# Patient Record
Sex: Female | Born: 1960 | Race: Black or African American | Hispanic: No | Marital: Married | Smoking: Never smoker
Health system: Southern US, Community
[De-identification: ages and names within clinical notes are randomized; demographics above are authoritative.]

## PROBLEM LIST (undated history)

## (undated) DIAGNOSIS — E079 Disorder of thyroid, unspecified: Secondary | ICD-10-CM

## (undated) DIAGNOSIS — I10 Essential (primary) hypertension: Secondary | ICD-10-CM

## (undated) DIAGNOSIS — E119 Type 2 diabetes mellitus without complications: Secondary | ICD-10-CM

## (undated) DIAGNOSIS — N200 Calculus of kidney: Secondary | ICD-10-CM

## (undated) DIAGNOSIS — T7840XA Allergy, unspecified, initial encounter: Secondary | ICD-10-CM

## (undated) DIAGNOSIS — E785 Hyperlipidemia, unspecified: Secondary | ICD-10-CM

## (undated) HISTORY — DX: Hyperlipidemia, unspecified: E78.5

## (undated) HISTORY — PX: TUBAL LIGATION: SHX77

## (undated) HISTORY — DX: Disorder of thyroid, unspecified: E07.9

## (undated) HISTORY — DX: Allergy, unspecified, initial encounter: T78.40XA

## (undated) HISTORY — DX: Essential (primary) hypertension: I10

## (undated) HISTORY — PX: OTHER SURGICAL HISTORY: SHX169

---

## 1996-03-27 HISTORY — PX: TUBAL LIGATION: SHX77

## 1998-05-31 ENCOUNTER — Encounter: Payer: Self-pay | Admitting: General Surgery

## 1998-06-01 ENCOUNTER — Ambulatory Visit (HOSPITAL_COMMUNITY): Admission: RE | Admit: 1998-06-01 | Discharge: 1998-06-02 | Payer: Self-pay | Admitting: General Surgery

## 1999-12-20 ENCOUNTER — Emergency Department (HOSPITAL_COMMUNITY): Admission: EM | Admit: 1999-12-20 | Discharge: 1999-12-20 | Payer: Self-pay | Admitting: *Deleted

## 2000-03-27 HISTORY — PX: CHOLECYSTECTOMY: SHX55

## 2001-01-16 ENCOUNTER — Ambulatory Visit (HOSPITAL_BASED_OUTPATIENT_CLINIC_OR_DEPARTMENT_OTHER): Admission: RE | Admit: 2001-01-16 | Discharge: 2001-01-17 | Payer: Self-pay | Admitting: *Deleted

## 2001-01-16 ENCOUNTER — Encounter (INDEPENDENT_AMBULATORY_CARE_PROVIDER_SITE_OTHER): Payer: Self-pay | Admitting: *Deleted

## 2001-06-11 ENCOUNTER — Encounter: Payer: Self-pay | Admitting: Emergency Medicine

## 2001-06-11 ENCOUNTER — Emergency Department (HOSPITAL_COMMUNITY): Admission: EM | Admit: 2001-06-11 | Discharge: 2001-06-11 | Payer: Self-pay | Admitting: Emergency Medicine

## 2004-05-24 ENCOUNTER — Other Ambulatory Visit: Admission: RE | Admit: 2004-05-24 | Discharge: 2004-05-24 | Payer: Self-pay | Admitting: Family Medicine

## 2004-05-27 ENCOUNTER — Encounter: Admission: RE | Admit: 2004-05-27 | Discharge: 2004-05-27 | Payer: Self-pay | Admitting: Family Medicine

## 2004-09-24 ENCOUNTER — Encounter: Admission: RE | Admit: 2004-09-24 | Discharge: 2004-09-24 | Payer: Self-pay | Admitting: Family Medicine

## 2005-10-17 ENCOUNTER — Encounter: Admission: RE | Admit: 2005-10-17 | Discharge: 2005-10-17 | Payer: Self-pay | Admitting: Family Medicine

## 2005-12-05 ENCOUNTER — Other Ambulatory Visit: Admission: RE | Admit: 2005-12-05 | Discharge: 2005-12-05 | Payer: Self-pay | Admitting: Obstetrics and Gynecology

## 2006-10-09 ENCOUNTER — Encounter: Admission: RE | Admit: 2006-10-09 | Discharge: 2006-10-09 | Payer: Self-pay | Admitting: Family Medicine

## 2006-10-15 ENCOUNTER — Emergency Department (HOSPITAL_COMMUNITY): Admission: EM | Admit: 2006-10-15 | Discharge: 2006-10-15 | Payer: Self-pay | Admitting: Emergency Medicine

## 2006-10-24 ENCOUNTER — Encounter: Admission: RE | Admit: 2006-10-24 | Discharge: 2006-10-24 | Payer: Self-pay | Admitting: Family Medicine

## 2007-10-25 ENCOUNTER — Encounter: Admission: RE | Admit: 2007-10-25 | Discharge: 2007-10-25 | Payer: Self-pay | Admitting: Family Medicine

## 2007-11-26 ENCOUNTER — Encounter: Admission: RE | Admit: 2007-11-26 | Discharge: 2007-11-26 | Payer: Self-pay | Admitting: Family Medicine

## 2008-07-09 ENCOUNTER — Encounter: Admission: RE | Admit: 2008-07-09 | Discharge: 2008-07-09 | Payer: Self-pay | Admitting: Family Medicine

## 2008-10-26 ENCOUNTER — Encounter: Admission: RE | Admit: 2008-10-26 | Discharge: 2008-10-26 | Payer: Self-pay | Admitting: Family Medicine

## 2009-10-29 ENCOUNTER — Encounter: Admission: RE | Admit: 2009-10-29 | Discharge: 2009-10-29 | Payer: Self-pay | Admitting: Advanced Practice Midwife

## 2010-03-27 HISTORY — PX: ENDOMETRIAL ABLATION: SHX621

## 2010-06-25 ENCOUNTER — Ambulatory Visit (HOSPITAL_COMMUNITY)
Admission: RE | Admit: 2010-06-25 | Discharge: 2010-06-25 | Disposition: A | Discharge status: Home / Self Care | Payer: Managed Care, Other (non HMO) | Source: Ambulatory Visit | Attending: Obstetrics and Gynecology | Admitting: Obstetrics and Gynecology

## 2010-06-25 ENCOUNTER — Other Ambulatory Visit: Payer: Self-pay | Admitting: Obstetrics and Gynecology

## 2010-06-25 DIAGNOSIS — D649 Anemia, unspecified: Secondary | ICD-10-CM | POA: Insufficient documentation

## 2010-06-25 DIAGNOSIS — D250 Submucous leiomyoma of uterus: Secondary | ICD-10-CM | POA: Insufficient documentation

## 2010-06-25 DIAGNOSIS — N840 Polyp of corpus uteri: Secondary | ICD-10-CM | POA: Insufficient documentation

## 2010-06-25 DIAGNOSIS — N920 Excessive and frequent menstruation with regular cycle: Secondary | ICD-10-CM | POA: Insufficient documentation

## 2010-06-25 LAB — CBC
HCT: 34.1 % — ABNORMAL LOW (ref 36.0–46.0)
MCH: 24.6 pg — ABNORMAL LOW (ref 26.0–34.0)
MCV: 78.4 fL (ref 78.0–100.0)
Platelets: 259 10*3/uL (ref 150–400)
RBC: 4.35 MIL/uL (ref 3.87–5.11)
RDW: 15.6 % — ABNORMAL HIGH (ref 11.5–15.5)

## 2010-06-29 NOTE — H&P (Signed)
  Donna Duarte, Donna Duarte                ACCOUNT NO.:  [id number]  MEDICAL RECORD NO.:  [id number]           PATIENT TYPE:  O  LOCATION:  [facility]                           FACILITY:  [facility]  PHYSICIAN:  Janine Limbo, M.D.DATE OF BIRTH:  1960-05-10  DATE OF ADMISSION:  05/18/2010 DATE OF DISCHARGE:                             HISTORY & PHYSICAL   HISTORY OF PRESENT ILLNESS:  Donna Duarte is a 50 year old, para 2-0-2-2, who presents for hysteroscopy with resection and a dilatation and curettage.  The patient has been followed at the [facility] for women. The patient complained of irregular cycles.  Her evaluation included a hydrosonogram which showed an 11.3 x 7.4 cm uterus.  The endometrium measured 7.53-cm.  The ovaries were within normal limits.  The patient was noted to have multiple fibroids with the largest fibroid measuring 2.43-cm.  The patient was noted to have a 2.16-cm endometrial polyps The patient has had a prior cesarean section and a prior tubal ligation.  OBSTETRICAL HISTORY:  The patient has had 2 elective pregnancy terminations, one term vaginal delivery, and one term cesarean delivery.  DRUG ALLERGIES:  The patient reports that she is allergic to PENICILLIN and FENTANYL.  PAST MEDICAL HISTORY:  The patient has a history of hypertension and she currently takes atenolol.  SOCIAL HISTORY:  The patient smokes occasionally.  She denies other recreational drug uses.  REVIEW OF SYSTEMS:  Please see history of present illness.  FAMILY HISTORY:  The patient has a family history of hypertension, diabetes, heart disease, kidney disease, and cancer (bone and stomach).  PHYSICAL EXAMINATION:  VITAL SIGNS:  Weight is 204 pounds, height is 5 feet 6 inches. HEENT:  Within normal limits. CHEST:  Clear. HEART:  Regular rate and rhythm. BREASTS:  Without masses. ABDOMEN:  Nontender. EXTREMITIES:  Grossly  normal. NEUROLOGIC:  Grossly normal. PELVIC:  External genitalia is normal.  Vagina is normal.  Cervix is nontender.  Uterus is 10-week size.  Adnexa no masses and rectovaginal exam confirms.  LABORATORY VALUES:  Endometrial biopsy showed benign elements.  ASSESSMENT: 1. Menorrhagia. 2. Fibroid uterus. 3. Endometrial polyps.  PLAN:  The patient will undergo hysteroscopy with resection of an endometrial polyp.  She understands the indications for her surgical procedure and she accepts the risks of, but not limited to, anesthetic complications, bleeding, infections, and possible damage to the surrounding organs.     Janine Limbo, M.D.     AVS/MEDQ  D:  06/23/2010  T:  06/24/2010  Job:  [id number]  Electronically Signed by Kirkland Hun M.D. on 06/29/2010 11:15:37 AM

## 2010-06-29 NOTE — Op Note (Signed)
NAMEFATMATA, LEGERE                ACCOUNT NO.:  [id number]  MEDICAL RECORD NO.:  [id number]           PATIENT TYPE:  O  LOCATION:  [facility]                          [facility]ILITY:  [facility]  PHYSICIAN:  Janine Limbo, M.D.DATE OF BIRTH:  [birth year]  DATE OF PROCEDURE:  06/25/2010 DATE OF DISCHARGE:                              OPERATIVE REPORT   PREOPERATIVE DIAGNOSES: 1. Menorrhagia. 2. Anemia (hemoglobin 10.7). 3. Fibroid uterus. 4. Endometrial polyps.  POSTOPERATIVE DIAGNOSES: 1. Menorrhagia. 2. Anemia (hemoglobin 10.7). 3. Fibroid uterus. 4. Endometrial polyps.  PROCEDURES: 1. Hysteroscopy with resection. 2. Dilatation and curettage.  SURGEON:  Janine Limbo, MD  FIRST ASSISTANT:  None.  ANESTHETIC:  General.  DISPOSITION:  Ms. Donna Duarte is a 50 year old female, para 2-0-2-2, who presents with the above-mentioned diagnoses.  The patient understands the indications for her surgical procedure and she accepts the risks of, but not limited to, anesthetic complications, bleeding, infections, and possible damage to the surrounding organs.  FINDINGS:  The uterus was approximately 10-week size and irregular.  No adnexal masses were appreciated.  The uterus sounded to 9 cm.  On hysteroscopy, the patient was noted to have a 2-3 cm endometrial polyp. She was also noted to have two submucosal fibroids with the largest measuring less than a centimeter in size.  No other pathology was appreciated.  PROCEDURE IN DETAIL:  The patient was taken to the operating room where a general anesthetic was given.  The patient's perineum and vagina were prepped with multiple layers of Betadine.  The bladder was drained of urine.  The patient was then sterilely draped.  Examination under anesthesia was performed.  A paracervical block was placed using 10 mL of 0.5% Marcaine with epinephrine.  An endocervical curettage was performed.  The uterus was sounded to 9 cm.  The cervix was  gently dilated.  The diagnostic hysteroscope was inserted and the cavity was carefully inspected.  The polyps and the fibroids were noted.  The diagnostic hysteroscope was removed and the cervix was dilated further. The operative hysteroscope was inserted.  We then used a single loop to resect the endometrial polyps and also the submucosal fibroids.  The hysteroscope was removed and the cavity was then curetted using a medium sharp curette until it was felt to be completely clean.  The hysteroscope was inserted once again and the cavity was inspected.  A few other areas were resected and the cavity was noted to be clean. Hemostasis was noted to be adequate.  All instruments were then removed. The uterus was reexamined and noted to be firm.  Sponge and needle counts were noted to be correct.  The patient was awakened from her anesthetic without difficulty, and she was transported to the recovery room in stable condition.  The endocervical curettings, the endometrial resections, and the endometrial curettings were sent to pathology.  The estimated fluid deficit was less than 200 mL.  The estimated blood loss was less than 30 mL.  FOLLOWUP INSTRUCTIONS:  The patient will return to see Dr. Stefano Gaul in 2 weeks for followup examination.  She was given a copy of the postoperative  instruction sheet as prepared by the [facility] for patients who have undergone hysteroscopy.  She will call for questions or concerns.  She was given a prescription for: 1. Motrin 800 mg 1 tablet every 8 hours as needed for mild-to-moderate     pain. 2. Vicodin 1 or 2 tablets every 4 hours as needed for severe pain. 3. Phenergan 25 mg 1 tablet every 6 hours as needed for nausea.     Janine Limbo, M.D.     AVS/MEDQ  D:  06/25/2010  T:  06/26/2010  Job:  [id number]  Electronically Signed by Kirkland Hun M.D. on 06/29/2010 11:15:50 AM

## 2010-08-12 NOTE — Op Note (Signed)
[place]  Patient:    Donna Duarte, Donna Duarte Visit Number: [id number] MRN: [id number]          Service Type: DSU Location: [facility] Attending Physician:  Aundria Mems Dictated by:   Kathy Breach, M.D. Proc. Date: 01/16/01 Admit Date:  01/16/2001                             Operative Report  PREOPERATIVE DIAGNOSIS:  Chronic tonsillitis and tonsilliths  PROCEDURE:  Tonsillectomy.  POSTOPERATIVE DIAGNOSIS:  Chronic tonsillitis and tonsilliths.  PROCEDURE:  The patient under general orotracheal anesthesia, the Crowe-Davis mouth gag was inserted, and the patient put in the Rutledge position.  The patient had small, deeply cryptic tonsils with large tonsilliths superior pole area left tonsil present.  Soft palate was normal in configuration.  Nasopharyngeal mirror examination revealed minimal adenoid tissue with no foreign debris present.  The left tonsil was grasped at the superior pole with a large tonsillith oozing from the deep crypts.  The tonsil was removed from the fossa by electrical dissection, maintaining hemostasis with electrocautery. The right tonsil was removed in similar fashion.  The patient tolerated the procedure well, was taken to the recovery room in stable general condition. Dictated by:   Kathy Breach, M.D. Attending Physician:  Aundria Mems DD:  01/16/01 TD:  01/17/01 Job: [id number]

## 2010-10-03 ENCOUNTER — Other Ambulatory Visit: Payer: Self-pay | Admitting: Family Medicine

## 2010-10-03 DIAGNOSIS — Z1231 Encounter for screening mammogram for malignant neoplasm of breast: Secondary | ICD-10-CM

## 2010-10-20 ENCOUNTER — Other Ambulatory Visit: Payer: Self-pay | Admitting: Internal Medicine

## 2010-10-20 DIAGNOSIS — E042 Nontoxic multinodular goiter: Secondary | ICD-10-CM

## 2010-10-24 ENCOUNTER — Ambulatory Visit
Admission: RE | Admit: 2010-10-24 | Discharge: 2010-10-24 | Disposition: A | Discharge status: Home / Self Care | Payer: Managed Care, Other (non HMO) | Source: Ambulatory Visit | Attending: Internal Medicine | Admitting: Internal Medicine

## 2010-10-24 DIAGNOSIS — E042 Nontoxic multinodular goiter: Secondary | ICD-10-CM

## 2010-11-03 ENCOUNTER — Ambulatory Visit
Admission: RE | Admit: 2010-11-03 | Discharge: 2010-11-03 | Disposition: A | Discharge status: Home / Self Care | Payer: Managed Care, Other (non HMO) | Source: Ambulatory Visit | Attending: Family Medicine | Admitting: Family Medicine

## 2010-11-03 DIAGNOSIS — Z1231 Encounter for screening mammogram for malignant neoplasm of breast: Secondary | ICD-10-CM

## 2011-12-01 ENCOUNTER — Other Ambulatory Visit: Payer: Self-pay | Admitting: Family Medicine

## 2011-12-01 DIAGNOSIS — Z1231 Encounter for screening mammogram for malignant neoplasm of breast: Secondary | ICD-10-CM

## 2011-12-13 ENCOUNTER — Ambulatory Visit
Admission: RE | Admit: 2011-12-13 | Discharge: 2011-12-13 | Disposition: A | Discharge status: Home / Self Care | Payer: Managed Care, Other (non HMO) | Source: Ambulatory Visit | Attending: Family Medicine | Admitting: Family Medicine

## 2011-12-13 DIAGNOSIS — Z1231 Encounter for screening mammogram for malignant neoplasm of breast: Secondary | ICD-10-CM

## 2011-12-15 ENCOUNTER — Other Ambulatory Visit: Payer: Self-pay | Admitting: Internal Medicine

## 2011-12-15 DIAGNOSIS — E049 Nontoxic goiter, unspecified: Secondary | ICD-10-CM

## 2011-12-19 ENCOUNTER — Ambulatory Visit (INDEPENDENT_AMBULATORY_CARE_PROVIDER_SITE_OTHER): Payer: Managed Care, Other (non HMO) | Admitting: Obstetrics and Gynecology

## 2011-12-19 ENCOUNTER — Encounter: Payer: Self-pay | Admitting: Obstetrics and Gynecology

## 2011-12-19 VITALS — BP 130/76 | Ht 67.0 in | Wt 216.0 lb

## 2011-12-19 DIAGNOSIS — Z01419 Encounter for gynecological examination (general) (routine) without abnormal findings: Secondary | ICD-10-CM

## 2011-12-19 NOTE — Progress Notes (Signed)
ANNUAL GYNECOLOGIC EXAMINATION   Donna Duarte is a 51 y.o. female, No obstetric history on file., who presents for an annual exam. She complains that her hair is thinning.  She has a family history of the same.  She continued to have monthly regular cycles.  She has a known history of fibroids.   History   Social History  . Marital Status: Married    Spouse Name: N/A    Number of Children: N/A  . Years of Education: N/A   Social History Main Topics  . Smoking status: Never Smoker   . Smokeless tobacco: Never Used  . Alcohol Use: Yes  . Drug Use: No  . Sexually Active: Yes    Birth Control/ Protection: Surgical     BTL   Other Topics Concern  . None   Social History Narrative  . None    Menstrual cycle:   LMP: Patient's last menstrual period was 12/16/2011.             The following portions of the patient's history were reviewed and updated as appropriate: allergies, current medications, past family history, past medical history, past social history, past surgical history and problem list.  Review of Systems Pertinent items are noted in HPI. Breast:Negative for breast lump,nipple discharge or nipple retraction Gastrointestinal: Negative for abdominal pain, change in bowel habits or rectal bleeding Urinary:negative   Objective:    BP 130/76  Ht 5\' 7"  (1.702 m)  Wt 216 lb (97.977 kg)  BMI 33.83 kg/m2  LMP 12/16/2011    Weight:  Wt Readings from Last 1 Encounters:  12/19/11 216 lb (97.977 kg)          BMI: Body mass index is 33.83 kg/(m^2).  General Appearance: Alert, appropriate appearance for age. No acute distress HEENT: Grossly normal, thinning hair Neck / Thyroid: Supple, no masses, nodes or enlargement Lungs: clear to auscultation bilaterally Back: No CVA tenderness Breast Exam: No masses or nodes.No dimpling, nipple retraction or discharge. Cardiovascular: Regular rate and rhythm. S1, S2, no murmur Gastrointestinal: Soft, non-tender, no masses or  organomegaly  ++++++++++++++++++++++++++++++++++++++++++++++++++++++++  Pelvic Exam: External genitalia: normal general appearance Vaginal: normal without tenderness, induration or masses. Relaxation: Yes Cervix: normal appearance Adnexa: normal bimanual exam Uterus: upper limits normal size, shape, and consistency Rectovaginal: normal rectal, no masses  ++++++++++++++++++++++++++++++++++++++++++++++++++++++++  Lymphatic Exam: Non-palpable nodes in neck, clavicular, axillary, or inguinal regions Neurologic: Normal speech, no tremor  Psychiatric: Alert and oriented, appropriate affect.  Assessment:    Normal gyn exam   Overweight or obese: Yes   Pelvic relaxation: Yes  alopecia   Plan:    mammogram pap smear return annually or prn Contraception:bilateral tubal ligation    Medications prescribed: none  STD screen request: No   The updated Pap smear screening guidelines were discussed with the patient. The patient requested that I obtain a Pap smear: Yes.  Kegel exercises discussed: Yes.  Proper diet and regular exercise were reviewed.  Annual mammograms recommended starting at age 61. Proper breast care was discussed.  Screening colonoscopy is recommended beginning at age 46.  Regular health maintenance was reviewed.  Sleep hygiene was discussed.  Adequate calcium and vitamin D intake was emphasized.  Leonard Schwartz M.D.   Regular Periods: yes Mammogram: yes  Monthly Breast Ex.: yes Exercise: no  Tetanus < 10 years: yes Seatbelts: yes  NI. Bladder Functn.: yes Abuse at home: no  Daily BM's: yes Stressful Work: yes  Healthy Diet: yes Sigmoid-Colonoscopy: yes 2011  Calcium: yes Medical problems this year: no   LAST PAP:2011 wnl   Contraception: BTL  Mammogram:  12/13/2011  PCP: Lavada Mesi  PMH: unchanged  FMH: unchanged   Last Bone Scan: never had one

## 2011-12-20 LAB — PAP IG W/ RFLX HPV ASCU

## 2011-12-25 ENCOUNTER — Ambulatory Visit
Admission: RE | Admit: 2011-12-25 | Discharge: 2011-12-25 | Disposition: A | Discharge status: Home / Self Care | Payer: Managed Care, Other (non HMO) | Source: Ambulatory Visit | Attending: Internal Medicine | Admitting: Internal Medicine

## 2011-12-25 DIAGNOSIS — E049 Nontoxic goiter, unspecified: Secondary | ICD-10-CM

## 2012-02-27 ENCOUNTER — Emergency Department (HOSPITAL_COMMUNITY): Payer: Managed Care, Other (non HMO)

## 2012-02-27 ENCOUNTER — Encounter (HOSPITAL_COMMUNITY): Payer: Self-pay | Admitting: *Deleted

## 2012-02-27 DIAGNOSIS — Z862 Personal history of diseases of the blood and blood-forming organs and certain disorders involving the immune mechanism: Secondary | ICD-10-CM | POA: Insufficient documentation

## 2012-02-27 DIAGNOSIS — R110 Nausea: Secondary | ICD-10-CM | POA: Insufficient documentation

## 2012-02-27 DIAGNOSIS — M549 Dorsalgia, unspecified: Secondary | ICD-10-CM | POA: Insufficient documentation

## 2012-02-27 DIAGNOSIS — Z7982 Long term (current) use of aspirin: Secondary | ICD-10-CM | POA: Insufficient documentation

## 2012-02-27 DIAGNOSIS — N201 Calculus of ureter: Secondary | ICD-10-CM | POA: Insufficient documentation

## 2012-02-27 DIAGNOSIS — Z8639 Personal history of other endocrine, nutritional and metabolic disease: Secondary | ICD-10-CM | POA: Insufficient documentation

## 2012-02-27 LAB — POCT I-STAT, CHEM 8
BUN: 10 mg/dL (ref 6–23)
Calcium, Ion: 1.23 mmol/L (ref 1.12–1.23)
Chloride: 103 mEq/L (ref 96–112)
Creatinine, Ser: 0.9 mg/dL (ref 0.50–1.10)
Glucose, Bld: 145 mg/dL — ABNORMAL HIGH (ref 70–99)
TCO2: 23 mmol/L (ref 0–100)

## 2012-02-27 LAB — URINALYSIS, ROUTINE W REFLEX MICROSCOPIC
Leukocytes, UA: NEGATIVE
Nitrite: NEGATIVE
Protein, ur: NEGATIVE mg/dL
Specific Gravity, Urine: 1.017 (ref 1.005–1.030)
Urobilinogen, UA: 0.2 mg/dL (ref 0.0–1.0)

## 2012-02-27 LAB — URINE MICROSCOPIC-ADD ON

## 2012-02-27 MED ORDER — OXYCODONE-ACETAMINOPHEN 5-325 MG PO TABS
1.0000 | ORAL_TABLET | Freq: Once | ORAL | Status: AC
  Administered 2012-02-27: 1 via ORAL
  Filled 2012-02-27: qty 1

## 2012-02-27 NOTE — ED Notes (Signed)
Patient with right side flank pain for about two hours.  Patient has history of kidney stones and states that the symptoms feel the same

## 2012-02-28 ENCOUNTER — Emergency Department (HOSPITAL_COMMUNITY): Payer: Managed Care, Other (non HMO)

## 2012-02-28 ENCOUNTER — Encounter (HOSPITAL_COMMUNITY): Payer: Self-pay | Admitting: Radiology

## 2012-02-28 ENCOUNTER — Emergency Department (HOSPITAL_COMMUNITY)
Admission: EM | Admit: 2012-02-28 | Discharge: 2012-02-28 | Disposition: A | Discharge status: Home / Self Care | Payer: Managed Care, Other (non HMO) | Attending: Emergency Medicine | Admitting: Emergency Medicine

## 2012-02-28 DIAGNOSIS — N201 Calculus of ureter: Secondary | ICD-10-CM

## 2012-02-28 LAB — HEPATIC FUNCTION PANEL
Albumin: 3.6 g/dL (ref 3.5–5.2)
Alkaline Phosphatase: 82 U/L (ref 39–117)
Total Protein: 7.5 g/dL (ref 6.0–8.3)

## 2012-02-28 MED ORDER — SODIUM CHLORIDE 0.9 % IV BOLUS (SEPSIS)
1000.0000 mL | Freq: Once | INTRAVENOUS | Status: AC
  Administered 2012-02-28: 1000 mL via INTRAVENOUS

## 2012-02-28 MED ORDER — IBUPROFEN 800 MG PO TABS: 800.0000 mg | ORAL_TABLET | Freq: Three times a day (TID) | ORAL | Status: DC

## 2012-02-28 MED ORDER — ONDANSETRON HCL 4 MG PO TABS: 4.0000 mg | ORAL_TABLET | Freq: Four times a day (QID) | ORAL | Status: DC

## 2012-02-28 MED ORDER — OXYCODONE-ACETAMINOPHEN 5-325 MG PO TABS: 2.0000 | ORAL_TABLET | ORAL | Status: DC | PRN

## 2012-02-28 NOTE — ED Provider Notes (Signed)
History     CSN: [id number]  Arrival date & time 02/27/12  2201   First MD Initiated Contact with Patient 02/28/12 0044      Chief Complaint  Patient presents with  . Flank Pain    (Consider location/radiation/quality/duration/timing/severity/associated sxs/prior treatment) HPI Comments: Patient presents with right-sided flank and abdominal pain that started about 2 hours ago while laying in bed. Symptoms feel similar to previous kidney stones. Pain is now resolved after receiving Percocet in the waiting room. She endorses nausea but no vomiting. Denies dysuria or hematuria. Denies fever, chills, chest pain or shortness of breath. Has never required surgery for kidney stones. Previous cholecystectomy.  The history is provided by the patient and a relative.    Past Medical History  Diagnosis Date  . Allergy   . Thyroid disease     Past Surgical History  Procedure Date  . Tubal ligation   . Cesarean section   . Endo cervix  cuttrage     Family History  Problem Relation Age of Onset  . Hypertension Mother   . Diabetes Mother     History  Substance Use Topics  . Smoking status: Never Smoker   . Smokeless tobacco: Never Used  . Alcohol Use: Yes    OB History    Grav Para Term Preterm Abortions TAB SAB Ect Mult Living                  Review of Systems  Constitutional: Negative for fever, activity change and appetite change.  Respiratory: Negative for chest tightness.   Cardiovascular: Negative for chest pain.  Gastrointestinal: Positive for nausea and abdominal pain. Negative for vomiting and diarrhea.  Genitourinary: Positive for flank pain. Negative for dysuria and hematuria.  Musculoskeletal: Positive for back pain.  Neurological: Negative for headaches.    Allergies  Fish oil; Penicillins; and Fentanyl  Home Medications   Current Outpatient Rx  Name  Route  Sig  Dispense  Refill  . ASPIRIN EC 81 MG PO TBEC   Oral   Take 81 mg by mouth daily.         Marland Kitchen ELETRIPTAN HYDROBROMIDE 40 MG PO TABS   Oral   One tablet by mouth at onset of headache. May repeat in 2 hours if headache persists or recurs. may repeat in 2 hours if necessary For migraine headaches         . LISINOPRIL 10 MG PO TABS   Oral   Take 10 mg by mouth daily.         . IBUPROFEN 800 MG PO TABS   Oral   Take 1 tablet (800 mg total) by mouth 3 (three) times daily.   21 tablet   0   . ONDANSETRON HCL 4 MG PO TABS   Oral   Take 1 tablet (4 mg total) by mouth every 6 (six) hours.   12 tablet   0   . OXYCODONE-ACETAMINOPHEN 5-325 MG PO TABS   Oral   Take 2 tablets by mouth every 4 (four) hours as needed for pain.   15 tablet   0     BP 137/80  Pulse 93  Temp 98.8 F (37.1 C) (Oral)  Resp 18  SpO2 98%  LMP 12/27/2011  Physical Exam  Constitutional: She is oriented to person, place, and time. She appears well-developed and well-nourished. No distress.       Appears comfortable  HENT:  Head: Normocephalic and atraumatic.  Mouth/Throat: Oropharynx is clear  and moist. No oropharyngeal exudate.  Eyes: Conjunctivae normal and EOM are normal. Pupils are equal, round, and reactive to light.  Neck: Normal range of motion. Neck supple.  Cardiovascular: Normal rate, regular rhythm and normal heart sounds.   Pulmonary/Chest: Effort normal and breath sounds normal.  Abdominal: Soft. There is no tenderness. There is no rebound and no guarding.       Mild R sided tenderness. No guarding or rebound.  Negative murphy's and mcburney's  Musculoskeletal: Normal range of motion. She exhibits no edema and no tenderness.       No CVAT  Neurological: She is alert and oriented to person, place, and time. No cranial nerve deficit. Coordination normal.  Skin: Skin is warm.    ED Course  Procedures (including critical care time)  Labs Reviewed  URINALYSIS, ROUTINE W REFLEX MICROSCOPIC - Abnormal; Notable for the following:    APPearance HAZY (*)     Hgb urine  dipstick MODERATE (*)     All other components within normal limits  URINE MICROSCOPIC-ADD ON - Abnormal; Notable for the following:    Squamous Epithelial / LPF FEW (*)     All other components within normal limits  POCT I-STAT, CHEM 8 - Abnormal; Notable for the following:    Glucose, Bld 145 (*)     Hemoglobin 11.6 (*)     HCT 34.0 (*)     All other components within normal limits  HEPATIC FUNCTION PANEL - Abnormal; Notable for the following:    Total Bilirubin 0.2 (*)     All other components within normal limits   Ct Abdomen Pelvis Wo Contrast  02/28/2012  *RADIOLOGY REPORT*  Clinical Data: Right flank pain.  Urolithiasis.  CT ABDOMEN AND PELVIS WITHOUT CONTRAST  Technique:  Multidetector CT imaging of the abdomen and pelvis was performed following the standard protocol without intravenous contrast.  Comparison: 10/09/2006  Findings: Two nonobstructing calculi are again seen in the left kidney, largest measuring 6 mm in the lower pole.  No right intrarenal calculi are identified.  Mild right hydronephrosis and ureterectasis is seen.  A 3-4 mm distal right ureteral calculus is seen.  The other abdominal parenchymal organs have a normal appearance on this noncontrast study.  Surgical clips are seen from prior cholecystectomy.  Enlarged uterus again seen with probable fibroid in the fundus.  No lymphadenopathy identified.  No evidence of inflammatory process or abnormal fluid collections.  No evidence of dilated bowel loops.  IMPRESSION:  1.  3-4 mm distal right ureteral calculus causing mild right hydronephrosis. 2.  Nonobstructing left nephrolithiasis. 3.  Enlarged uterus with probable fundal fibroid.   Original Report Authenticated By: Myles Rosenthal, M.D.    Dg Abd 1 View  02/27/2012  *RADIOLOGY REPORT*  Clinical Data: Right flank pain. Urolithiasis.  ABDOMEN - 1 VIEW  Comparison:  None.  Findings:  The bowel gas pattern is normal.  There is no evidence of free air.  No radio-opaque calculi or  other significant radiographic abnormality is seen. Surgical clips seen from prior cholecystectomy.  IMPRESSION: Negative.   Original Report Authenticated By: Myles Rosenthal, M.D.      1. Ureterolithiasis       MDM  Right-sided abdominal pain and flank pain with history of kidney stone. Vital stable, no distress, pain free now  Hematuria in UA without infection. Patient pain-free at her Percocet. Creatinine within normal limits. Distal right-sided ureteral calculus seen. We'll treat with pain medication, anti-inflammatories, urology followup.  Glynn Octave, MD 02/28/12 [phone]

## 2012-08-13 ENCOUNTER — Other Ambulatory Visit: Payer: Self-pay | Admitting: Obstetrics and Gynecology

## 2012-10-22 ENCOUNTER — Other Ambulatory Visit: Payer: Self-pay | Admitting: Internal Medicine

## 2012-10-22 DIAGNOSIS — E042 Nontoxic multinodular goiter: Secondary | ICD-10-CM

## 2012-10-24 ENCOUNTER — Ambulatory Visit
Admission: RE | Admit: 2012-10-24 | Discharge: 2012-10-24 | Disposition: A | Discharge status: Home / Self Care | Payer: BC Managed Care – PPO | Source: Ambulatory Visit | Attending: Internal Medicine | Admitting: Internal Medicine

## 2012-10-24 DIAGNOSIS — E042 Nontoxic multinodular goiter: Secondary | ICD-10-CM

## 2012-11-05 ENCOUNTER — Other Ambulatory Visit: Payer: Self-pay

## 2012-11-05 DIAGNOSIS — Z1231 Encounter for screening mammogram for malignant neoplasm of breast: Secondary | ICD-10-CM

## 2012-11-13 ENCOUNTER — Encounter (INDEPENDENT_AMBULATORY_CARE_PROVIDER_SITE_OTHER): Payer: Self-pay | Admitting: Surgery

## 2012-11-13 ENCOUNTER — Ambulatory Visit (INDEPENDENT_AMBULATORY_CARE_PROVIDER_SITE_OTHER): Payer: BC Managed Care – PPO | Admitting: Surgery

## 2012-11-13 VITALS — BP 142/86 | HR 92 | Temp 97.3°F | Resp 16 | Ht 65.5 in | Wt 208.2 lb

## 2012-11-13 DIAGNOSIS — E042 Nontoxic multinodular goiter: Secondary | ICD-10-CM | POA: Insufficient documentation

## 2012-11-13 NOTE — Patient Instructions (Signed)
Thyroid Diseases  Your thyroid is a butterfly-shaped gland in your neck. It is located just above your collarbone. It is one of your endocrine glands, which make hormones. The thyroid helps set your metabolism. Metabolism is how your body gets energy from the foods you eat.   Millions of people have thyroid diseases. Women experience thyroid problems more often than men. In fact, overactive thyroid problems (hyperthyroidism) occur in 1% of all women. If you have a thyroid disease, your body may use energy more slowly or quickly than it should.   Thyroid problems also include an immune disease where your body reacts against your thyroid gland (called thyroiditis). A different problem involves lumps and bumps (called nodules) that develop in the gland. The nodules are usually, but not always, noncancerous.  THE MOST COMMON THYROID PROBLEMS AND CAUSES ARE DISCUSSED BELOW  There are many causes for thyroid problems. Treatment depends upon the exact diagnosis and includes trying to reset your body's metabolism to a normal rate.  Hyperthyroidism  Too much thyroid hormone from an overactive thyroid gland is called hyperthyroidism. In hyperthyroidism, the body's metabolism speeds up. One of the most frequent forms of hyperthyroidism is known as Graves' disease. Graves' disease tends to run in families. Although Graves' is thought to be caused by a problem with the immune system, the exact nature of the genetic problem is unknown.  Hypothyroidism  Too little thyroid hormone from an underactive thyroid gland is called hypothyroidism. In hypothyroidism, the body's metabolism is slowed. Several things can cause this condition. Most causes affect the thyroid gland directly and hurt its ability to make enough hormone.   Rarely, there may be a pituitary gland tumor (located near the base of the brain). The tumor can block the pituitary from producing thyroid-stimulating hormone (TSH). Your body makes TSH to stimulate the thyroid  to work properly. If the pituitary does not make enough TSH, the thyroid fails to make enough hormones needed for good health.  Whether the problem is caused by thyroid conditions or by the pituitary gland, the result is that the thyroid is not making enough hormones. Hypothyroidism causes many physical and mental processes to become sluggish. The body consumes less oxygen and produces less body heat.  Thyroid Nodules  A thyroid nodule is a small swelling or lump in the thyroid gland. They are common. These nodules represent either a growth of thyroid tissue or a fluid-filled cyst. Both form a lump in the thyroid gland. Almost half of all people will have tiny thyroid nodules at some point in their lives. Typically, these are not noticeable until they become large and affect normal thyroid size. Larger nodules that are greater than a half inch across (about 1 centimeter) occur in about 5 percent of people.  Although most nodules are not cancerous, people who have them should seek medical care to rule out cancer. Also, some thyroid nodules may produce too much thyroid hormone or become too large. Large nodules or a large gland can interfere with breathing or swallowing or may cause neck discomfort.  Other problems  Other thyroid problems include cancer and thyroiditis. Thyroiditis is a malfunction of the body's immune system. Normally, the immune system works to defend the body against infection and other problems. When the immune system is not working properly, it may mistakenly attack normal cells, tissues, and organs. Examples of autoimmune diseases are Hashimoto's thyroiditis (which causes low thyroid function) and Graves' disease (which causes excess thyroid function).  SYMPTOMS   Symptoms   vary greatly depending upon the exact type of problem with the thyroid.  Hyperthyroidism-is when your thyroid is too active and makes more thyroid hormone than your body needs. The most common cause is Graves' Disease. Too  much thyroid hormone can cause some or all of the following symptoms:  · Anxiety.  · Irritability.  · Difficulty sleeping.  · Fatigue.  · A rapid or irregular heartbeat.  · A fine tremor of your hands or fingers.  · An increase in perspiration.  · Sensitivity to heat.  · Weight loss, despite normal food intake.  · Brittle hair.  · Enlargement of your thyroid gland (goiter).  · Light menstrual periods.  · Frequent bowel movements.  Graves' disease can specifically cause eye and skin problems. The skin problems involve reddening and swelling of the skin, often on your shins and on the top of your feet. Eye problems can include the following:  · Excess tearing and sensation of grit or sand in either or both eyes.  · Reddened or inflamed eyes.  · Widening of the space between your eyelids.  · Swelling of the lids and tissues around the eyes.  · Light sensitivity.  · Ulcers on the cornea.  · Double vision.  · Limited eye movements.  · Blurred or reduced vision.  Hypothyroidism- is when your thyroid gland is not active enough. This is more common than hyperthyroidism. Symptoms can vary a lot depending of the severity of the hormone deficiency. Symptoms may develop over a long period of time and can include several of the following:  · Fatigue.  · Sluggishness.  · Increased sensitivity to cold.  · Constipation.  · Pale, dry skin.  · A puffy face.  · Hoarse voice.  · High blood cholesterol level.  · Unexplained weight gain.  · Muscle aches, tenderness and stiffness.  · Pain, stiffness or swelling in your joints.  · Muscle weakness.  · Heavier than normal menstrual periods.  · Brittle fingernails and hair.  · Depression.  Thyroid Nodules - most do not cause signs or symptoms. Occasionally, some may become so large that you can feel or even see the swelling at the base of your neck. You may realize a lump or swelling is there when you are shaving or putting on makeup. Men might become aware of a nodule when shirt collars  suddenly feel too tight.  Some nodules produce too much thyroid hormone. This can produce the same symptoms as hyperthyroidism (see above).  Thyroid nodules are seldom cancerous. However, a nodule is more likely to be malignant (cancerous) if it:  · Grows quickly or feels hard.  · Causes you to become hoarse or to have trouble swallowing or breathing.  · Causes enlarged lymph nodes under your jaw or in your neck.  DIAGNOSIS   Because there are so many possible thyroid conditions, your caregiver may ask for a number of tests. They will do this in order to narrow down the exact diagnosis. These tests can include:  · Blood and antibody tests.  · Special thyroid scans using small, safe amounts of radioactive iodine.  · Ultrasound of the thyroid gland (particularly if there is a nodule or lump).  · Biopsy. This is usually done with a special needle. A needle biopsy is a procedure to obtain a sample of cells from the thyroid. The tissue will be tested in a lab and examined under a microscope.  TREATMENT   Treatment depends on the exact diagnosis.    Hyperthyroidism  · Beta-blockers help relieve many of the symptoms.  · Anti-thyroid medications prevent the thyroid from making excess hormones.  · Radioactive iodine treatment can destroy overactive thyroid cells. The iodine can permanently decrease the amount of hormone produced.  · Surgery to remove the thyroid gland.  · Treatments for eye problems that come from Graves' disease also include medications and special eye surgery, if felt to be appropriate.  Hypothyroidism  Thyroid replacement with levothyroxine is the mainstay of treatment. Treatment with thyroid replacement is usually lifelong and will require monitoring and adjustment from time to time.  Thyroid Nodules  · Watchful waiting. If a small nodule causes no symptoms or signs of cancer on biopsy, then no treatment may be chosen at first. Re-exam and re-checking blood tests would be the recommended  follow-up.  · Anti-thyroid medications or radioactive iodine treatment may be recommended if the nodules produce too much thyroid hormone (see Treatment for Hyperthyroidism above).  · Alcohol ablation. Injections of small amounts of ethyl alcohol (ethanol) can cause a non-cancerous nodule to shrink in size.  · Surgery (see Treatment for Hyperthyroidism above).  HOME CARE INSTRUCTIONS   · Take medications as instructed.  · Follow through on recommended testing.  SEEK MEDICAL CARE IF:   · You feel that you are developing symptoms of Hyperthyroidism or Hypothyroidism as described above.  · You develop a new lump/nodule in the neck/thyroid area that you had not noticed before.  · You feel that you are having side effects from medicines prescribed.  · You develop trouble breathing or swallowing.  SEEK IMMEDIATE MEDICAL CARE IF:   · You develop a fever of 102° F (38.9° C) or higher.  · You develop severe sweating.  · You develop palpitations and/or rapid heart beat.  · You develop shortness of breath.  · You develop nausea and vomiting.  · You develop extreme shakiness.  · You develop agitation.  · You develop lightheadedness or have a fainting episode.  Document Released: 01/08/2007 Document Revised: 06/05/2011 Document Reviewed: 01/08/2007  [employer]® Patient Information ©2014 [employer].

## 2012-11-13 NOTE — Progress Notes (Signed)
General Surgery [facility]  Chief Complaint  Patient presents with  . New Evaluation    multinodular thyroid - referral from Dr. Debara Pickett; primary care is Milus Height, [place]    HISTORY: Patient is a 52 year old female referred by her endocrinologist to discuss possible thyroid surgery. The patient has a multinodular thyroid goiter. This has been present for several years. She has never been on thyroid medication. She has been evaluated with sequential ultrasound scans. Laboratory levels are normal and her most recent TSH level was normal at 1.07. She has undergone previous fine-needle aspiration biopsies of the dominant nodules at [facility]. Cytopathology results were reportedly benign.  Patient denies any prior history of head or neck surgery. There is no family history of thyroid disease. Specifically there is no history of thyroid cancer. There is no family history of other endocrinopathy.  Patient denies any compressive symptoms. She denies palpitations. She denies tremor.  Patient has had recent chest pain on occasion. She is scheduled for cardiology consultation in the near future.  Past Medical History  Diagnosis Date  . Allergy   . Thyroid disease   . Hypertension     Current Outpatient Prescriptions  Medication Sig Dispense Refill  . aspirin EC 81 MG tablet Take 81 mg by mouth daily.      Marland Kitchen lisinopril (PRINIVIL,ZESTRIL) 10 MG tablet Take 10 mg by mouth daily.      Marland Kitchen eletriptan (RELPAX) 40 MG tablet One tablet by mouth at onset of headache. May repeat in 2 hours if headache persists or recurs. may repeat in 2 hours if necessary For migraine headaches       No current facility-administered medications for this visit.    Allergies  Allergen Reactions  . Fish Oil Hives and Itching  . Penicillins Hives and Swelling  . Fentanyl Itching    Family History  Problem Relation Age of Onset  . Hypertension Mother   . Diabetes Mother      History   Social History  . Marital Status: Married    Spouse Name: N/A    Number of Children: N/A  . Years of Education: N/A   Social History Main Topics  . Smoking status: Never Smoker   . Smokeless tobacco: Never Used  . Alcohol Use: Yes  . Drug Use: No  . Sexual Activity: Yes    Birth Control/ Protection: Surgical     Comment: BTL   Other Topics Concern  . None   Social History Narrative  . None    REVIEW OF SYSTEMS - PERTINENT POSITIVES ONLY: Denies tremor. Denies palpitation. Denies compressive symptoms.  EXAM: Filed Vitals:   11/13/12 1006  BP: 142/86  Pulse: 92  Temp: 97.3 F (36.3 C)  Resp: 16    HEENT: normocephalic; pupils equal and reactive; sclerae clear; dentition good; mucous membranes moist NECK:  Firm thyroid gland, diffusely nodular, right lobe larger than left; asymmetric on extension; no palpable anterior or posterior cervical lymphadenopathy; no supraclavicular masses; no tenderness CHEST: clear to auscultation bilaterally without rales, rhonchi, or wheezes CARDIAC: regular rate and rhythm without significant murmur; peripheral pulses are full EXT:  non-tender without edema; no deformity NEURO: no gross focal deficits; no sign of tremor   LABORATORY RESULTS: See [facility] (CHL-Epic) for most recent results  RADIOLOGY RESULTS: See [facility] (CHL-Epic) for most recent results  IMPRESSION: Multinodular thyroid goiter, clinically stable  PLAN: The patient and I discussed multinodular thyroid goiter and its  management at length. I explained to her that her risk of malignancy based on her studies is less than 10%. We discussed surgical management with total thyroidectomy. We discussed the risk and benefits. We discussed the hospital stay to be anticipated. We discussed the need for lifelong thyroid hormone replacement and continued laboratory studies for monitoring. She understands. We discussed the option of continued  observation with physical examination, laboratory studies, and ultrasound examination when indicated. She understands this approach as well.  Patient is scheduled for cardiology consultation in the near future. I have encouraged her to keep that appointment incomplete data evaluation. I've asked her to send [place] a copy of those results.  I've asked the patient to discuss possible thyroid surgery with her family. She will review the written literature which I provided to her today.  After her cardiology consultation is complete, the patient will make a decision about continued observation of her multinodular goiter versus proceeding with total thyroidectomy.  Velora Heckler, MD, FACS General & Endocrine Surgery [facility]  Primary Care Physician: Pcp Not In System

## 2012-11-18 ENCOUNTER — Encounter (INDEPENDENT_AMBULATORY_CARE_PROVIDER_SITE_OTHER): Payer: Self-pay

## 2012-12-16 ENCOUNTER — Ambulatory Visit
Admission: RE | Admit: 2012-12-16 | Discharge: 2012-12-16 | Disposition: A | Discharge status: Home / Self Care | Payer: BC Managed Care – PPO | Source: Ambulatory Visit

## 2012-12-16 DIAGNOSIS — Z1231 Encounter for screening mammogram for malignant neoplasm of breast: Secondary | ICD-10-CM

## 2012-12-17 ENCOUNTER — Other Ambulatory Visit: Payer: Self-pay | Admitting: Physician Assistant

## 2012-12-17 DIAGNOSIS — R928 Other abnormal and inconclusive findings on diagnostic imaging of breast: Secondary | ICD-10-CM

## 2012-12-30 ENCOUNTER — Other Ambulatory Visit: Payer: Self-pay | Admitting: Physician Assistant

## 2012-12-30 ENCOUNTER — Ambulatory Visit
Admission: RE | Admit: 2012-12-30 | Discharge: 2012-12-30 | Disposition: A | Discharge status: Home / Self Care | Payer: BC Managed Care – PPO | Source: Ambulatory Visit | Attending: Physician Assistant | Admitting: Physician Assistant

## 2012-12-30 DIAGNOSIS — R928 Other abnormal and inconclusive findings on diagnostic imaging of breast: Secondary | ICD-10-CM

## 2013-01-06 ENCOUNTER — Ambulatory Visit
Admission: RE | Admit: 2013-01-06 | Discharge: 2013-01-06 | Disposition: A | Discharge status: Home / Self Care | Payer: BC Managed Care – PPO | Source: Ambulatory Visit | Attending: Physician Assistant | Admitting: Physician Assistant

## 2013-01-06 DIAGNOSIS — R928 Other abnormal and inconclusive findings on diagnostic imaging of breast: Secondary | ICD-10-CM

## 2013-02-26 ENCOUNTER — Telehealth (INDEPENDENT_AMBULATORY_CARE_PROVIDER_SITE_OTHER): Payer: Self-pay

## 2013-02-26 NOTE — Telephone Encounter (Signed)
Received call from Dr. Britta Mccreedy re: pt wanting breast clip removed that was placed at core bx. She states pt in convinced it is causing her chest wall pain. Dr. Mayford Knife states she has had discussion at length with pt but pt still wants clip removed.  Pt has seen Dr Gerrit Friends in past for thyroid and Dr. Mayford Knife is making referral to Dr Gerrit Friends to examine and speak with pt re: this. I reviewed request with Dr. Gerrit Friends. Per his request I called pt and offered next available new problem slot. The pt states " You may as well go ahead and set up surgery to get this thing out of me too". I advised pt that she will need office exam first per Dr Gerrit Friends to determine if surgery should be considered. Pt states " It is up to me to have something that could be harming my body removed". At pts request I gave her phone # to Dr Mayford Knife and states she will call her and ask for referral to a doctor that will see her sooner and remove clip. Pt then disconnected call.

## 2013-03-04 ENCOUNTER — Telehealth (INDEPENDENT_AMBULATORY_CARE_PROVIDER_SITE_OTHER): Payer: Self-pay | Admitting: *Deleted

## 2013-03-04 NOTE — Telephone Encounter (Signed)
Patient walked in this morning to the office stating that she has a clip which has been left inside her back in October.  Patient states that 2 weeks ago she began having pain at the site, nausea, dizziness, slurred speech at times, difficulty processing thoughts, and difficulty walking straight.  Patient states she believes these symptoms are coming from the clip.  Patient is currently set up for an appt with Dr. Dwain Sarna on 12/29 however patient states she feels this is becoming life threatening and doesn't feel she can wait that long.  I spoke to patient in depth about a couple options.  Explained that Dr. Abbey Chatters is here today and is willing to see her possibly if and only if he can speak to Dr. Jean Rosenthal regarding this patient to understand more about this patient.  Also explained to the patient that if she feels this is "life threatening" as she stated then she also has the option to go to the ED.  Explained that if the ED physician were to agree with her thoughts of this being life threatening then they could consult the surgeon on call.  Patient states that even though she thinks this is life threatening she doesn't think that other people are feeling that way including Nedra Hai and Dr. Jean Rosenthal so she doesn't want to spend the money in the ED.  I explained that [facility] and request that Dr. Jean Rosenthal call and speak with Dr. Abbey Chatters then once Dr. Abbey Chatters has made a decision we will give her a call to update her with what the plan will be.  Patient states understanding and agreeable at this time.

## 2013-03-05 NOTE — Telephone Encounter (Signed)
Late note from 03/04/13:  Dr. Abbey Chatters spoke to Dr. Jean Rosenthal and it was decided that patient did not need to be seen urgently.  Dr. Abbey Chatters states that patient just needs to be added onto next available MD's schedule or pt come in for current scheduled appt with Dr. Dwain Sarna 03/24/13.  Patient states she can't wait until 03/24/13, patient states "I know the doctors don't think this is an emergency but it is an emergency for me."  Appt made with Dr. Derrell Lolling who has next available new pt/establish pt appt on 03/12/13 per recommendations made.

## 2013-03-12 ENCOUNTER — Ambulatory Visit (INDEPENDENT_AMBULATORY_CARE_PROVIDER_SITE_OTHER): Payer: BC Managed Care – PPO | Admitting: General Surgery

## 2013-03-24 ENCOUNTER — Encounter (INDEPENDENT_AMBULATORY_CARE_PROVIDER_SITE_OTHER): Payer: BC Managed Care – PPO | Admitting: General Surgery

## 2013-11-27 ENCOUNTER — Other Ambulatory Visit: Payer: Self-pay | Admitting: Internal Medicine

## 2013-11-27 DIAGNOSIS — E042 Nontoxic multinodular goiter: Secondary | ICD-10-CM

## 2013-12-23 ENCOUNTER — Ambulatory Visit
Admission: RE | Admit: 2013-12-23 | Discharge: 2013-12-23 | Disposition: A | Discharge status: Home / Self Care | Payer: BC Managed Care – PPO | Source: Ambulatory Visit | Attending: Internal Medicine | Admitting: Internal Medicine

## 2013-12-23 DIAGNOSIS — E042 Nontoxic multinodular goiter: Secondary | ICD-10-CM

## 2016-04-09 ENCOUNTER — Encounter (HOSPITAL_COMMUNITY): Payer: Self-pay

## 2016-04-09 ENCOUNTER — Emergency Department (HOSPITAL_COMMUNITY): Payer: BC Managed Care – PPO

## 2016-04-09 ENCOUNTER — Emergency Department (HOSPITAL_COMMUNITY)
Admission: EM | Admit: 2016-04-09 | Discharge: 2016-04-10 | Disposition: A | Discharge status: Home / Self Care | Payer: BC Managed Care – PPO | Attending: Emergency Medicine | Admitting: Emergency Medicine

## 2016-04-09 DIAGNOSIS — I10 Essential (primary) hypertension: Secondary | ICD-10-CM | POA: Insufficient documentation

## 2016-04-09 DIAGNOSIS — N200 Calculus of kidney: Secondary | ICD-10-CM

## 2016-04-09 DIAGNOSIS — N202 Calculus of kidney with calculus of ureter: Secondary | ICD-10-CM | POA: Diagnosis not present

## 2016-04-09 DIAGNOSIS — R1032 Left lower quadrant pain: Secondary | ICD-10-CM | POA: Diagnosis present

## 2016-04-09 LAB — URINALYSIS, ROUTINE W REFLEX MICROSCOPIC
Bilirubin Urine: NEGATIVE
GLUCOSE, UA: NEGATIVE mg/dL
Ketones, ur: NEGATIVE mg/dL
Leukocytes, UA: NEGATIVE
NITRITE: NEGATIVE
PH: 7 (ref 5.0–8.0)
PROTEIN: NEGATIVE mg/dL
Specific Gravity, Urine: 1.011 (ref 1.005–1.030)

## 2016-04-09 LAB — CBC
HCT: 40.3 % (ref 36.0–46.0)
HEMOGLOBIN: 13.5 g/dL (ref 12.0–15.0)
MCH: 28.8 pg (ref 26.0–34.0)
MCHC: 33.5 g/dL (ref 30.0–36.0)
MCV: 85.9 fL (ref 78.0–100.0)
PLATELETS: 197 10*3/uL (ref 150–400)
RBC: 4.69 MIL/uL (ref 3.87–5.11)
RDW: 13.1 % (ref 11.5–15.5)
WBC: 5.8 10*3/uL (ref 4.0–10.5)

## 2016-04-09 LAB — COMPREHENSIVE METABOLIC PANEL
ALK PHOS: 95 U/L (ref 38–126)
ALT: 23 U/L (ref 14–54)
ANION GAP: 9 (ref 5–15)
AST: 21 U/L (ref 15–41)
Albumin: 3.9 g/dL (ref 3.5–5.0)
BUN: 9 mg/dL (ref 6–20)
CALCIUM: 9.5 mg/dL (ref 8.9–10.3)
CO2: 25 mmol/L (ref 22–32)
Chloride: 104 mmol/L (ref 101–111)
Creatinine, Ser: 0.92 mg/dL (ref 0.44–1.00)
GFR calc non Af Amer: >60 mL/min (ref >60)
Glucose, Bld: 150 mg/dL — ABNORMAL HIGH (ref 65–99)
Potassium: 4.4 mmol/L (ref 3.5–5.1)
Sodium: 138 mmol/L (ref 135–145)
TOTAL PROTEIN: 7.4 g/dL (ref 6.5–8.1)
Total Bilirubin: <0.1 mg/dL — ABNORMAL LOW (ref 0.3–1.2)

## 2016-04-09 LAB — LIPASE, BLOOD: Lipase: 29 U/L (ref 11–51)

## 2016-04-09 MED ORDER — KETOROLAC TROMETHAMINE 15 MG/ML IJ SOLN: 15.0000 mg | Freq: Once | INTRAMUSCULAR | Status: DC

## 2016-04-09 MED ORDER — KETOROLAC TROMETHAMINE 15 MG/ML IJ SOLN
30.0000 mg | Freq: Once | INTRAMUSCULAR | Status: AC
  Administered 2016-04-09: 30 mg via INTRAMUSCULAR
  Filled 2016-04-09: qty 2

## 2016-04-09 NOTE — ED Triage Notes (Signed)
Onset this morning left flank pain radiating to abdomen and nausea.  No vomiting or urinary symptoms.

## 2016-04-09 NOTE — ED Notes (Signed)
Pt has not taken Lisinopril in past 1 week.  Pt concerns that Lisinopril causes diabetes and is going to talk with her pcp about these concerns.

## 2016-04-09 NOTE — ED Notes (Signed)
Pt c/o LLQ pain onset this am with radiation to L flank intermittent through the day. Pt does have hx of kidney stones. Pt denies GU s/s. Pt states nausea and pain have resolved since arriving in the ED.

## 2016-04-09 NOTE — ED Notes (Signed)
Taken to Xray at this time

## 2016-04-09 NOTE — ED Provider Notes (Signed)
[facility] DEPT Provider Note   CSN: [id number] Arrival date & time: 04/09/16  [id number]  By signing my name below, I, Ephriam Jenkins, attest that this documentation has been prepared under the direction and in the presence of Merryl Hacker, MD. Electronically signed, Ephriam Jenkins, ED Scribe. 04/09/16. 11:37 PM.   History   Chief Complaint Chief Complaint  Patient presents with  . Flank Pain    HPI HPI Comments: Donna Duarte is a 56 y.o. female, with Hx of multiple kidney stones, who presents to the Emergency Department complaining of worsening, constant left flank/LLQ pain that started this morning. She states that the pain intermittently radiates to her left flank. On arrival to the ED, she sat down in a chair in the waiting room and states that her pain improved while waiting. Pt reports that she did have some associated nausea during this time but has currently resolved. She states that this feels very similar to previous kidney stones. She has passed her previous kidney stones. She has seen a Dealer with [facility] but does not recall his/her name. No dysuria, hematuria. No vomiting or diarrhea.  The history is provided by the patient. No language interpreter was used.    Past Medical History:  Diagnosis Date  . Allergy   . Hypertension   . Thyroid disease     Patient Active Problem List   Diagnosis Date Noted  . 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 CESAREAN SECTION    . endo cervix  cuttrage    . TUBAL LIGATION      OB History    No data available       Home Medications    Prior to Admission medications   Medication Sig Start Date End Date Taking? Authorizing Provider  citalopram (CELEXA) 20 MG tablet Take 20 mg by mouth daily.   Yes Historical Provider, MD  lisinopril (PRINIVIL,ZESTRIL) 10 MG tablet Take 10 mg by mouth daily.   Yes Historical Provider, MD  oxyCODONE-acetaminophen  (PERCOCET/ROXICET) 5-325 MG tablet Take 1-2 tablets by mouth every 6 (six) hours as needed for severe pain. 04/10/16   Merryl Hacker, MD  tamsulosin (FLOMAX) 0.4 MG CAPS capsule Take 1 capsule (0.4 mg total) by mouth daily. 04/10/16   Merryl Hacker, MD    Family History Family History  Problem Relation Age of Onset  . Hypertension Mother   . Diabetes Mother     Social History Social History  Substance Use Topics  . Smoking status: Never Smoker  . Smokeless tobacco: Never Used  . Alcohol use Yes     Allergies   Fish oil; Penicillins; and Fentanyl   Review of Systems Review of Systems  Constitutional: Negative for fever.  Respiratory: Negative for shortness of breath.   Gastrointestinal: Positive for abdominal pain (LLQ) and nausea. Negative for diarrhea and vomiting.  Genitourinary: Positive for flank pain (Left). Negative for dysuria and hematuria.  All other systems reviewed and are negative.   Physical Exam Updated Vital Signs BP 146/87 (BP Location: Right Arm)   Pulse 85   Temp 97.9 F (36.6 C) (Oral)   Resp 18   LMP 12/15/2012   SpO2 98%   Physical Exam  Constitutional: She is oriented to person, place, and time. She appears well-developed and well-nourished. No distress.  Overweight  HENT:  Head: Normocephalic and atraumatic.  Cardiovascular: Normal rate, regular rhythm and normal heart sounds.   Pulmonary/Chest: Effort  normal and breath sounds normal. No respiratory distress. She has no wheezes.  Abdominal: Soft. Bowel sounds are normal. There is no tenderness. There is no guarding.  Genitourinary:  Genitourinary Comments: No CVA tenderness  Neurological: She is alert and oriented to person, place, and time.  Skin: Skin is warm and dry.  Psychiatric: She has a normal mood and affect.  Nursing note and vitals reviewed.    ED Treatments / Results  DIAGNOSTIC STUDIES: Oxygen Saturation is 98% on RA, normal by my interpretation.  COORDINATION OF  CARE: 11:24 PM-Discussed treatment plan with pt at bedside and pt agreed to plan.   Labs (all labs ordered are listed, but only abnormal results are displayed) Labs Reviewed  COMPREHENSIVE METABOLIC PANEL - Abnormal; Notable for the following:       Result Value   Glucose, Bld 150 (*)    Total Bilirubin <0.1 (*)    All other components within normal limits  URINALYSIS, ROUTINE W REFLEX MICROSCOPIC - Abnormal; Notable for the following:    Color, Urine STRAW (*)    APPearance HAZY (*)    Hgb urine dipstick MODERATE (*)    Bacteria, UA RARE (*)    Squamous Epithelial / LPF 0-5 (*)    All other components within normal limits  LIPASE, BLOOD  CBC    EKG  EKG Interpretation None       Radiology Dg Abdomen 1 View  Result Date: 04/10/2016 CLINICAL DATA:  Left flank pain. EXAM: ABDOMEN - 1 VIEW COMPARISON:  CT 02/28/2012 FINDINGS: Irregularity 15 mm calcification in the region of the left proximal ureter/renal pelvis. Additional small calculi projecting over the lower possibly mid left kidney. Bilateral pelvic calcifications felt to be phleboliths. Small to moderate stool burden. Cholecystectomy clips in the right upper quadrant. An additional surgical clip is seen in the left upper abdomen. No acute osseous abnormality. IMPRESSION: Left renal stones. A 15 mm irregularly-shaped calcification is in the region of the left proximal ureter/ renal pelvis. Electronically Signed   By: Jeb Levering M.D.   On: 04/10/2016 00:13    Procedures Procedures (including critical care time)  Medications Ordered in ED Medications  ketorolac (TORADOL) 15 MG/ML injection 30 mg (30 mg Intramuscular Given 04/09/16 2338)     Initial Impression / Assessment and Plan / ED Course  I have reviewed the triage vital signs and the nursing notes.  Pertinent labs & imaging results that were available during my care of the patient were reviewed by me and considered in my medical decision making (see chart  for details).  Clinical Course     Patient presents with left flank and abdominal pain consistent with prior kidney stones. She has been pain-free for several hours. Lab work reviewed. She does have hematuria. Kidney function is normal. KUB obtained and shows multiple stones within the kidneys and likely a 1.5 cm stone on the left distal ureter. Patient remains pain-free. She was given Toradol. Discussed with patient the utility of an ultrasound to evaluate for hydronephrosis. Given that she is pain-free, the patient would like to forego this evaluation at this time. Feel this is reasonable given that she has normal renal function. Will discharge on Flomax. Urology follow-up. If she has worsening pain or decreased urine output she needs to be reevaluated immediately.  After history, exam, and medical workup I feel the patient has been appropriately medically screened and is safe for discharge home. Pertinent diagnoses were discussed with the patient. Patient was given return  precautions.   Final Clinical Impressions(s) / ED Diagnoses   Final diagnoses:  Kidney stone    New Prescriptions New Prescriptions   OXYCODONE-ACETAMINOPHEN (PERCOCET/ROXICET) 5-325 MG TABLET    Take 1-2 tablets by mouth every 6 (six) hours as needed for severe pain.   TAMSULOSIN (FLOMAX) 0.4 MG CAPS CAPSULE    Take 1 capsule (0.4 mg total) by mouth daily.   I personally performed the services described in this documentation, which was scribed in my presence. The recorded information has been reviewed and is accurate.     Merryl Hacker, MD 04/10/16 0030

## 2016-04-10 MED ORDER — TAMSULOSIN HCL 0.4 MG PO CAPS: 0.4000 mg | ORAL_CAPSULE | Freq: Every day | ORAL | 0 refills | Status: DC

## 2016-04-10 MED ORDER — OXYCODONE-ACETAMINOPHEN 5-325 MG PO TABS: 1.0000 | ORAL_TABLET | Freq: Four times a day (QID) | ORAL | 0 refills | Status: DC | PRN

## 2016-04-10 NOTE — Discharge Instructions (Signed)
You were seen today and likely have a kidney stone. Your pain was well controlled upon my evaluation. On your x-ray, you may have a large stone that you are in the process of passing approximately 1.5 cm. Sometimes it is difficult to pass stones this large. However, given that you are asymptomatic at this time, he will be discharged. you will be started on Flomax. Follow-up with urology recommended. If you have worsening pain, decreased urination or any new or worsening symptoms she needs to be reevaluated.

## 2018-10-08 ENCOUNTER — Encounter: Payer: Self-pay | Admitting: Internal Medicine

## 2019-04-02 LAB — HM PAP SMEAR

## 2019-04-03 ENCOUNTER — Other Ambulatory Visit: Payer: Self-pay | Admitting: Obstetrics and Gynecology

## 2019-04-03 DIAGNOSIS — E049 Nontoxic goiter, unspecified: Secondary | ICD-10-CM

## 2019-04-07 ENCOUNTER — Other Ambulatory Visit: Payer: BC Managed Care – PPO

## 2019-06-13 LAB — HM COLONOSCOPY

## 2020-03-29 ENCOUNTER — Ambulatory Visit: Payer: Self-pay | Admitting: Internal Medicine

## 2020-03-29 ENCOUNTER — Telehealth: Payer: Self-pay

## 2020-03-29 NOTE — Telephone Encounter (Signed)
The patient called and left a message that she couldn't get in touch with HR at [facility], that [employer] is saying that she doesn't have insurance coverage but that she has made ;payments through her employer, so she is going to reschedule for the March 9th 11:45 appt time.I left the pt a message that the office got her message and the appt for today has been rescheduled for that date and time.

## 2020-04-19 DIAGNOSIS — Z713 Dietary counseling and surveillance: Secondary | ICD-10-CM | POA: Diagnosis not present

## 2020-06-02 ENCOUNTER — Encounter: Payer: Self-pay | Admitting: Internal Medicine

## 2020-06-02 ENCOUNTER — Other Ambulatory Visit: Payer: Self-pay

## 2020-06-02 ENCOUNTER — Ambulatory Visit: Payer: BC Managed Care – PPO | Admitting: Internal Medicine

## 2020-06-02 VITALS — BP 140/98 | HR 87 | Temp 97.6°F | Ht 66.0 in | Wt 226.2 lb

## 2020-06-02 DIAGNOSIS — Z6836 Body mass index (BMI) 36.0-36.9, adult: Secondary | ICD-10-CM | POA: Diagnosis not present

## 2020-06-02 DIAGNOSIS — A6000 Herpesviral infection of urogenital system, unspecified: Secondary | ICD-10-CM | POA: Diagnosis not present

## 2020-06-02 DIAGNOSIS — R7309 Other abnormal glucose: Secondary | ICD-10-CM

## 2020-06-02 DIAGNOSIS — Z1159 Encounter for screening for other viral diseases: Secondary | ICD-10-CM | POA: Diagnosis not present

## 2020-06-02 DIAGNOSIS — Z01419 Encounter for gynecological examination (general) (routine) without abnormal findings: Secondary | ICD-10-CM | POA: Diagnosis not present

## 2020-06-02 DIAGNOSIS — I10 Essential (primary) hypertension: Secondary | ICD-10-CM | POA: Diagnosis not present

## 2020-06-02 DIAGNOSIS — E7800 Pure hypercholesterolemia, unspecified: Secondary | ICD-10-CM | POA: Diagnosis not present

## 2020-06-02 DIAGNOSIS — Z1231 Encounter for screening mammogram for malignant neoplasm of breast: Secondary | ICD-10-CM | POA: Diagnosis not present

## 2020-06-02 DIAGNOSIS — E638 Other specified nutritional deficiencies: Secondary | ICD-10-CM | POA: Diagnosis not present

## 2020-06-02 DIAGNOSIS — Z7689 Persons encountering health services in other specified circumstances: Secondary | ICD-10-CM

## 2020-06-02 LAB — POCT URINALYSIS DIPSTICK
Bilirubin, UA: NEGATIVE
Glucose, UA: NEGATIVE
Ketones, UA: NEGATIVE
Nitrite, UA: NEGATIVE
Protein, UA: NEGATIVE
Spec Grav, UA: 1.02 (ref 1.010–1.025)
Urobilinogen, UA: 0.2 E.U./dL
pH, UA: 6.5 (ref 5.0–8.0)

## 2020-06-02 LAB — POCT UA - MICROALBUMIN
Creatinine, POC: 100 mg/dL
Microalbumin Ur, POC: 80 mg/L

## 2020-06-02 LAB — HM MAMMOGRAPHY

## 2020-06-02 NOTE — Progress Notes (Signed)
Rutherford Nail as a scribe for [name], MD.,have documented all relevant documentation on the behalf of [name], MD,as directed by  [name], MD while in the presence of [name], MD. This visit occurred during the SARS-CoV-2 public health emergency.  Safety protocols were in place, including screening questions prior to the visit, additional usage of staff PPE, and extensive cleaning of exam room while observing appropriate contact time as indicated for disinfecting solutions.  Subjective:     Patient ID: Donna Duarte , female    DOB: 08/06/1960 , 60 y.o.   MRN: [id number]   Chief Complaint  Patient presents with  . Establish Care  . Hypertension    HPI  Pt presents today to establish care.  The patient was referred by [facility].  She is a 60 year old female who presents with past medical history significant for high blood pressure and high cholesterol.  She denies h/o heart disease.  She does admit to recent diagnosis of diabetes. She was advised by her previous provider that medications are not needed at this time. She is now under the care of a nutritionist. 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  She is married with 2 adult children.   Hypertension This is a chronic problem. The current episode started more than 1 year ago. The problem has been gradually improving since onset. The problem is controlled. Pertinent negatives include no blurred vision, chest pain, headaches, palpitations or shortness of breath. Risk factors for coronary artery disease include dyslipidemia and obesity. Past treatments include ACE inhibitors. The current treatment provides moderate improvement.     Past Medical History:  Diagnosis Date  . Allergy   . Hyperlipidemia   . Hypertension   . Thyroid disease      Family History  Problem Relation Age of Onset  . Hypertension Mother   . Diabetes Mother      Current Outpatient  Medications:  .  acyclovir ointment (ZOVIRAX) 5 %, acyclovir 5 % topical ointment  APPLY TO THE AFFECTED AREA(S) BY TOPICAL ROUTE PRN, Disp: , Rfl:  .  atorvastatin (LIPITOR) 10 MG tablet, Take 10 mg by mouth daily., Disp: , Rfl:  .  eletriptan (RELPAX) 40 MG tablet, , Disp: , Rfl:  .  ibuprofen (ADVIL) 800 MG tablet, ibuprofen 800 mg tablet  Take 1 tablet by mouth at 11:00pm on the day prior to procedure, Disp: , Rfl:  .  lisinopril (PRINIVIL,ZESTRIL) 10 MG tablet, Take 10 mg by mouth daily., Disp: , Rfl:    Allergies  Allergen Reactions  . Fish Oil Hives and Itching  . Penicillins Hives and Swelling  . Fentanyl Itching     Review of Systems  Constitutional: Negative.  Negative for fatigue.  HENT: Negative.   Eyes: Negative for blurred vision.  Respiratory: Negative.  Negative for shortness of breath.   Cardiovascular: Negative.  Negative for chest pain and palpitations.  Gastrointestinal: Negative.   Endocrine: Negative for polydipsia, polyphagia and polyuria.  Genitourinary: Negative.   Musculoskeletal: Negative.   Skin: Negative.   Allergic/Immunologic: Negative.   Neurological: Negative for dizziness and headaches.  Psychiatric/Behavioral: Negative.   All other systems reviewed and are negative.    Today's Vitals   06/02/20 1203  BP: (!) 140/98  Pulse: 87  Temp: 97.6 F (36.4 C)  TempSrc: Oral  Weight: 226 lb 3.2 oz (102.6 kg)  Height: $Remove'5\' 6"'FFlpvmH$  (1.676 m)  Body mass index is 36.51 kg/m.  Wt Readings from Last 3 Encounters:  06/02/20 226 lb 3.2 oz (102.6 kg)  11/13/12 208 lb 3.2 oz (94.4 kg)  12/19/11 216 lb (98 kg)   Objective:  Physical Exam Vitals and nursing note reviewed.  Constitutional:      General: She is not in acute distress.    Appearance: Normal appearance. She is obese.  HENT:     Head: Normocephalic and atraumatic.     Nose: No congestion.     Comments: Masked     Mouth/Throat:     Comments: Masked  Eyes:     Extraocular Movements:  Extraocular movements intact.     Pupils: Pupils are equal, round, and reactive to light.  Cardiovascular:     Rate and Rhythm: Normal rate and regular rhythm.     Pulses: Normal pulses.     Heart sounds: Normal heart sounds. No murmur heard.   Pulmonary:     Effort: Pulmonary effort is normal. No respiratory distress.     Breath sounds: Normal breath sounds. No wheezing.  Musculoskeletal:     Cervical back: Normal range of motion.  Skin:    General: Skin is warm and dry.     Capillary Refill: Capillary refill takes less than 2 seconds.  Neurological:     General: No focal deficit present.     Mental Status: She is alert and oriented to person, place, and time.     Cranial Nerves: No cranial nerve deficit.  Psychiatric:        Mood and Affect: Mood normal.        Behavior: Behavior normal.        Thought Content: Thought content normal.        Judgment: Judgment normal.         Assessment And Plan:     1. Essential hypertension, benign Comments: Chronic, uncontrolled. Pt advised BP goal is less than 130/80. EKG performed, NSR w/o acute changes. Advised to follow low sodium diet. She will continue with lisinopril $RemoveBeforeD'10mg'cBecBZDXlTzJUe$  daily for now. If elevated at next visit, I plan to switch her to ARB therapy, likely valsartan $RemoveBeforeDE'160mg'MReHfPmLzvZSjbK$  daily. She is also encouraged to increase her daily activity. She will f/u in 3 months for re-evaluation.  - CBC - CMP14+EGFR - Lipid panel - POCT Urinalysis Dipstick (81002) - POCT UA - Microalbumin - EKG 12-Lead  2. Pure hypercholesterolemia Comments: Chronic, she will c/w atorvastatin. Advised to avoid fried foods, increase exercise and increase fiber intake.  - Lipid panel  3. Other abnormal glucose Comments: I will check an a1c today to confirm diagnosis of diabetes. We discussed use of GLP-1 agents to treat diabetes. She does not wish to start until labwork is available for review. She is encouraged to decrease her intake of sugary beverages including  sodas, juices and including diet drinks.  - Hemoglobin A1c - CBC - CMP14+EGFR  4. Class 2 severe obesity due to excess calories with serious comorbidity and body mass index (BMI) of 36.0 to 36.9 in adult [facility]) Comments:  She is encouraged to strive for BMI less than 30 to decrease cardiac risk. Advised to aim for at least 150 minutes of exercise per week.  5. Encounter to establish care with new doctor  6. Encounter for HCV screening test for low risk patient Comments: I will check HCV antibody.  - Hepatitis C antibody  Patient was given opportunity to ask questions. Patient verbalized understanding of the plan and  was able to repeat key elements of the plan. All questions were answered to their satisfaction.   I, [name], MD, have reviewed all documentation for this visit. The documentation on 06/02/20 for the exam, diagnosis, procedures, and orders are all accurate and complete.  THE PATIENT IS ENCOURAGED TO PRACTICE SOCIAL DISTANCING DUE TO THE COVID-19 PANDEMIC.

## 2020-06-02 NOTE — Patient Instructions (Signed)

## 2020-06-03 LAB — CBC
Hematocrit: 42.4 % (ref 34.0–46.6)
Hemoglobin: 14.3 g/dL (ref 11.1–15.9)
MCH: 29.3 pg (ref 26.6–33.0)
MCHC: 33.7 g/dL (ref 31.5–35.7)
MCV: 87 fL (ref 79–97)
Platelets: 194 10*3/uL (ref 150–450)
RBC: 4.88 x10E6/uL (ref 3.77–5.28)
RDW: 13.2 % (ref 11.7–15.4)
WBC: 6 10*3/uL (ref 3.4–10.8)

## 2020-06-03 LAB — CMP14+EGFR
ALT: 17 IU/L (ref 0–32)
AST: 18 IU/L (ref 0–40)
Albumin/Globulin Ratio: 1.4 (ref 1.2–2.2)
Albumin: 4.3 g/dL (ref 3.8–4.8)
Alkaline Phosphatase: 104 IU/L (ref 44–121)
BUN/Creatinine Ratio: 11 — ABNORMAL LOW (ref 12–28)
BUN: 11 mg/dL (ref 8–27)
Bilirubin Total: 0.3 mg/dL (ref 0.0–1.2)
CO2: 21 mmol/L (ref 20–29)
Calcium: 9.6 mg/dL (ref 8.7–10.3)
Chloride: 101 mmol/L (ref 96–106)
Creatinine, Ser: 1.02 mg/dL — ABNORMAL HIGH (ref 0.57–1.00)
Globulin, Total: 3 g/dL (ref 1.5–4.5)
Glucose: 86 mg/dL (ref 65–99)
Potassium: 3.9 mmol/L (ref 3.5–5.2)
Sodium: 138 mmol/L (ref 134–144)
Total Protein: 7.3 g/dL (ref 6.0–8.5)
eGFR: 63 mL/min/{1.73_m2} (ref >59)

## 2020-06-03 LAB — LIPID PANEL
Chol/HDL Ratio: 3.4 ratio (ref 0.0–4.4)
Cholesterol, Total: 133 mg/dL (ref 100–199)
HDL: 39 mg/dL — ABNORMAL LOW (ref >39)
LDL Chol Calc (NIH): 74 mg/dL (ref 0–99)
Triglycerides: 107 mg/dL (ref 0–149)
VLDL Cholesterol Cal: 20 mg/dL (ref 5–40)

## 2020-06-03 LAB — HEMOGLOBIN A1C
Est. average glucose Bld gHb Est-mCnc: 148 mg/dL
Hgb A1c MFr Bld: 6.8 % — ABNORMAL HIGH (ref 4.8–5.6)

## 2020-06-03 LAB — HEPATITIS C ANTIBODY: Hep C Virus Ab: <0.1 s/co ratio (ref 0.0–0.9)

## 2020-06-04 ENCOUNTER — Telehealth: Payer: Self-pay

## 2020-06-04 NOTE — Telephone Encounter (Signed)
-----   Message from Glendale Chard, MD sent at 06/03/2020  1:18 PM EST ----- Your hba1c is 6.8, this is in diabetes range. I suggest starting once weekly Ozempic. Are you able to come in next Tuesday for teaching?  You are negative for hepatitis c virus. Your blood count is normal. Your kidney function is in the stage 2 CKD range. Be sure to stay well hydrated and keep bp well controlled. Your HDL is low, this is your good cholesterol. It is at 78, our goal is 50. Be sure to increase your exercise.

## 2020-06-04 NOTE — Telephone Encounter (Signed)
Left the patient a message to call back for lab results. 

## 2020-06-06 ENCOUNTER — Encounter: Payer: Self-pay | Admitting: Internal Medicine

## 2020-06-21 ENCOUNTER — Telehealth: Payer: Self-pay | Admitting: Internal Medicine

## 2020-06-21 NOTE — Telephone Encounter (Signed)
Returned patients phone call about ozempic training left VM

## 2020-06-22 ENCOUNTER — Ambulatory Visit: Payer: BC Managed Care – PPO

## 2020-06-22 ENCOUNTER — Other Ambulatory Visit: Payer: Self-pay

## 2020-06-22 VITALS — BP 132/80 | HR 81 | Temp 98.1°F | Ht 66.0 in | Wt 228.0 lb

## 2020-06-22 DIAGNOSIS — I10 Essential (primary) hypertension: Secondary | ICD-10-CM

## 2020-06-22 NOTE — Progress Notes (Signed)
Pt is here today for ozempic training.

## 2020-06-25 ENCOUNTER — Telehealth: Payer: Self-pay

## 2020-06-25 NOTE — Telephone Encounter (Signed)
Left pt a message about medical release form needing to be signed, come to the office when available to do so.

## 2020-06-28 ENCOUNTER — Telehealth: Payer: Self-pay

## 2020-06-28 MED ORDER — LISINOPRIL 10 MG PO TABS: 10.0000 mg | ORAL_TABLET | Freq: Every day | ORAL | 1 refills | Status: DC

## 2020-06-28 MED ORDER — ATORVASTATIN CALCIUM 10 MG PO TABS: 10.0000 mg | ORAL_TABLET | Freq: Every day | ORAL | 1 refills | Status: DC

## 2020-06-28 NOTE — Telephone Encounter (Signed)
The pt said that she will try to come on Friday to sign her form for medical release on Thursday.

## 2020-06-30 ENCOUNTER — Telehealth: Payer: Self-pay

## 2020-06-30 NOTE — Telephone Encounter (Signed)
The pt said she needed a prior auth on her relpax and ozempic.

## 2020-07-13 ENCOUNTER — Other Ambulatory Visit: Payer: Self-pay

## 2020-07-14 ENCOUNTER — Telehealth: Payer: Self-pay

## 2020-07-14 ENCOUNTER — Other Ambulatory Visit: Payer: Self-pay | Admitting: Internal Medicine

## 2020-07-14 MED ORDER — OZEMPIC (0.25 OR 0.5 MG/DOSE) 2 MG/1.5ML ~~LOC~~ SOPN: PEN_INJECTOR | SUBCUTANEOUS | 1 refills | Status: DC

## 2020-07-14 MED ORDER — ELETRIPTAN HYDROBROMIDE 40 MG PO TABS: 40.0000 mg | ORAL_TABLET | Freq: Every day | ORAL | 1 refills | Status: DC | PRN

## 2020-07-14 MED ORDER — PEN NEEDLES 32G X 4 MM MISC: 1 refills | Status: AC

## 2020-07-14 NOTE — Telephone Encounter (Signed)
The pt was notified that the office has a sample of ozempic available for pickup and that a prescription for relpax, ozempic, and pen needles [facility]

## 2020-07-16 ENCOUNTER — Telehealth: Payer: Self-pay

## 2020-07-16 NOTE — Telephone Encounter (Signed)
Notified pt that Ozempic medication had been approved. Pt stated that she would come by around 12:45 Monday to pick up an sample.

## 2020-07-26 ENCOUNTER — Encounter: Payer: Self-pay | Admitting: Internal Medicine

## 2020-08-02 ENCOUNTER — Encounter: Payer: Self-pay | Admitting: Internal Medicine

## 2020-08-11 ENCOUNTER — Telehealth: Payer: Self-pay

## 2020-08-11 NOTE — Telephone Encounter (Signed)
The pt left a message that she needed a neruology referral because she just found out that she has stage 2 renal disease, she would like a provider outside of the [facility].  I called the pt to schedule her an appt for evaluation for the referral she is requesting.

## 2020-08-17 ENCOUNTER — Ambulatory Visit: Payer: BC Managed Care – PPO | Admitting: Internal Medicine

## 2020-09-15 ENCOUNTER — Other Ambulatory Visit: Payer: Self-pay

## 2020-09-15 ENCOUNTER — Ambulatory Visit: Payer: BC Managed Care – PPO | Admitting: Nurse Practitioner

## 2020-09-15 ENCOUNTER — Encounter: Payer: Self-pay | Admitting: Nurse Practitioner

## 2020-09-15 VITALS — BP 138/80 | HR 72 | Temp 98.4°F | Ht 65.4 in | Wt 220.6 lb

## 2020-09-15 DIAGNOSIS — R82998 Other abnormal findings in urine: Secondary | ICD-10-CM

## 2020-09-15 DIAGNOSIS — E119 Type 2 diabetes mellitus without complications: Secondary | ICD-10-CM | POA: Diagnosis not present

## 2020-09-15 DIAGNOSIS — R319 Hematuria, unspecified: Secondary | ICD-10-CM

## 2020-09-15 DIAGNOSIS — I10 Essential (primary) hypertension: Secondary | ICD-10-CM | POA: Diagnosis not present

## 2020-09-15 DIAGNOSIS — K644 Residual hemorrhoidal skin tags: Secondary | ICD-10-CM | POA: Diagnosis not present

## 2020-09-15 LAB — POCT URINALYSIS DIPSTICK
Bilirubin, UA: NEGATIVE
Glucose, UA: NEGATIVE
Ketones, UA: NEGATIVE
Nitrite, UA: NEGATIVE
Protein, UA: NEGATIVE
Spec Grav, UA: 1.02 (ref 1.010–1.025)
Urobilinogen, UA: 0.2 E.U./dL
pH, UA: 6.5 (ref 5.0–8.0)

## 2020-09-15 MED ORDER — HYDROCORTISONE (PERIANAL) 2.5 % EX CREA: 1.0000 "application " | TOPICAL_CREAM | Freq: Two times a day (BID) | CUTANEOUS | 0 refills | Status: DC

## 2020-09-15 NOTE — Patient Instructions (Signed)
Hydrocortisone suppositories What is this medication? HYDROCORTISONE (hye droe KOR ti sone) is a corticosteroid. It is used to decrease swelling, itching, and pain that is caused by minor skin irritationsor by hemorrhoids. This medicine may be used for other purposes; ask your health care provider orpharmacist if you have questions. COMMON BRAND NAME(S): Anucort-HC, Anumed-HC, Anusol HC, Encort, GRx HiCort, Hemmorex-HC, Hemorrhoidal-HC, Hemril, Proctocort, Proctosert HC, Proctosol-HC,Rectacort HC, Rectasol-HC What should I tell my care team before I take this medication? They need to know if you have any of these conditions: an unusual or allergic reaction to hydrocortisone, corticosteroids, other medicines, foods, dyes, or preservatives pregnant or trying to get pregnant breast-feeding How should I use this medication? This medicine is for rectal use only. Do not take by mouth. Wash your hands before and after use. Take off the foil wrapping. Wet the tip of the suppository with cold tap water to make it easier to use. Lie on your side with your lower leg straightened out and your upper leg bent forward toward your stomach. Lift upper buttock to expose the rectal area. Apply gentle pressure to insert the suppository completely into the rectum, pointed end first. Hold buttocks together for a few seconds. Remain lying down for about 15 minutes toavoid having the suppository come out. Do not use more often than directed. Talk to your pediatrician regarding the use of this medicine in children.Special care may be needed. Overdosage: If you think you have taken too much of this medicine contact apoison control center or emergency room at once. NOTE: This medicine is only for you. Do not share this medicine with others. What if I miss a dose? If you miss a dose, use it as soon as you can. If it is almost time for yournext dose, use only that dose. Do not use double or extra doses. What may interact with  this medication? Interactions are not expected. Do not use any other rectal products on theaffected area without telling your doctor or health care professional. This list may not describe all possible interactions. Give your health care provider a list of all the medicines, herbs, non-prescription drugs, or dietary supplements you use. Also tell them if you smoke, drink alcohol, or use illegaldrugs. Some items may interact with your medicine. What should I watch for while using this medication? Visit your doctor or health care professional for regular checks on your progress. Tell your doctor or health care professional if your symptoms do notimprove after a few days of use. Do not use if there is blood in your stools. If you get any type of infection while using this medicine, you may need to stop using this medicine until our infections clears up. Ask your doctor orhealth care professional for advice. What side effects may I notice from receiving this medication? Side effects that you should report to your doctor or health care professionalas soon as possible: bloody or black, tarry stools painful, red, pus filled blisters in hair follicles rectal pain, burning or bleeding after use of medicine Side effects that usually do not require medical attention (report to yourdoctor or health care professional if they continue or are bothersome): changes in skin color dry skin itching or irritation This list may not describe all possible side effects. Call your doctor for medical advice about side effects. You may report side effects to FDA at1-800-FDA-1088. Where should I keep my medication? Keep out of the reach of children. Store at room temperature between 20 and 25 degrees C (  68 and 77 degrees F). Protect from heat and freezing. Throw away any unused medicine after theexpiration date. NOTE: This sheet is a summary. It may not cover all possible information. If you have questions about this  medicine, talk to your doctor, pharmacist, orhealth care provider.  2022 Elsevier/Gold Standard (2007-07-26 16:07:24)

## 2020-09-15 NOTE — Progress Notes (Signed)
I,Donna Duarte,acting as a Education administrator for [name], NP.,have documented all relevant documentation on the behalf of [name], NP,as directed by  Donna Castilla, NP while in the presence of Donna Castilla, NP.  This visit occurred during the SARS-CoV-2 public health emergency.  Safety protocols were in place, including screening questions prior to the visit, additional usage of staff PPE, and extensive cleaning of exam room while observing appropriate contact time as indicated for disinfecting solutions.  Subjective:     Patient ID: Donna Duarte , female    DOB: Jan 03, 1961 , 60 y.o.   MRN: [id number]   Chief Complaint  Patient presents with   Diabetes   Hypertension     HPI  Patient is here for DM and htn check. She also have complaints of hemorrhoids.  She is doing fine with the ozempic. She also has a history of blood in the urine as well as kidney stones. She would like to see a specialist for that.      Past Medical History:  Diagnosis Date   Allergy    Hyperlipidemia    Hypertension    Thyroid disease      Family History  Problem Relation Age of Onset   Hypertension Mother    Diabetes Mother      Current Outpatient Medications:    acyclovir ointment (ZOVIRAX) 5 %, acyclovir 5 % topical ointment  APPLY TO THE AFFECTED AREA(S) BY TOPICAL ROUTE PRN, Disp: , Rfl:    atorvastatin (LIPITOR) 10 MG tablet, Take 1 tablet (10 mg total) by mouth daily., Disp: 30 tablet, Rfl: 1   eletriptan (RELPAX) 40 MG tablet, Take 1 tablet (40 mg total) by mouth daily as needed for migraine or headache., Disp: 30 tablet, Rfl: 1   hydrocortisone (PROCTOSOL HC) 2.5 % rectal cream, Place 1 application rectally 2 (two) times daily., Disp: 30 g, Rfl: 0   ibuprofen (ADVIL) 800 MG tablet, ibuprofen 800 mg tablet  Take 1 tablet by mouth at 11:00pm on the day prior to procedure, Disp: , Rfl:    Insulin Pen Needle (PEN NEEDLES) 32G X 4 MM MISC, Use as directed with Ozempic pen,  Disp: 100 each, Rfl: 1   lisinopril (ZESTRIL) 10 MG tablet, Take 1 tablet (10 mg total) by mouth daily., Disp: 30 tablet, Rfl: 1   Semaglutide,0.25 or 0.5MG /DOS, (OZEMPIC, 0.25 OR 0.5 MG/DOSE,) 2 MG/1.5ML SOPN, Inject 0.5 mg subcutaneously once weekly, Disp: 4.5 mL, Rfl: 1   Allergies  Allergen Reactions   Fish Oil Hives and Itching   Penicillins Hives and Swelling   Fentanyl Itching     Review of Systems  Constitutional: Negative.  Negative for chills and fatigue.  HENT:  Negative for congestion, rhinorrhea, sinus pressure and sinus pain.   Respiratory: Negative.  Negative for cough, shortness of breath and wheezing.   Cardiovascular: Negative.  Negative for chest pain.  Gastrointestinal:  Positive for rectal pain.  Endocrine: Negative for polydipsia, polyphagia and polyuria.  Genitourinary:  Positive for hematuria. Negative for dysuria and frequency.  Musculoskeletal:  Negative for back pain and neck pain.  Neurological: Negative.  Negative for weakness and headaches.    Today's Vitals   09/15/20 1458  BP: 138/80  Pulse: 72  Temp: 98.4 F (36.9 C)  TempSrc: Oral  Weight: 220 lb 9.6 oz (100.1 kg)  Height: 5' 5.4" (1.661 m)   Body mass index is 36.26 kg/m.   Objective:  Physical Exam Constitutional:      Appearance: Normal appearance.  Cardiovascular:     Rate and Rhythm: Normal rate and regular rhythm.     Pulses: Normal pulses.     Heart sounds: Normal heart sounds. No murmur heard. Pulmonary:     Effort: Pulmonary effort is normal. No respiratory distress.     Breath sounds: Normal breath sounds. No wheezing.  Musculoskeletal:     Cervical back: Normal range of motion and neck supple.  Skin:    General: Skin is warm and dry.     Capillary Refill: Capillary refill takes less than 2 seconds.  Neurological:     Mental Status: She is alert and oriented to person, place, and time.        Assessment And Plan:     1. Essential hypertension, benign -Chronic,  continue meds -Limit the intake of processed foods and salt intake. You should increase your intake of green vegetables and fruits. Limit the use of alcohol. Limit fast foods and fried foods. Avoid high fatty saturated and trans fat foods. Keep yourself hydrated with drinking water. Avoid red meats. Eat lean meats instead. Exercise for atleast 30-45 min for atleast 4-5 times a week.  - CMP14+EGFR - CBC no Diff  2. Type 2 diabetes mellitus without complication, without long-term current use of insulin (HCC) -chronic, continue meds --Discussed with patient the importance of glycemic control and long term complications from uncontrolled diabetes. Discussed with the patient the importance of compliance with home glucose monitoring, diet which includes decrease amount of sugary drinks and foods. Importance of exercise was also discussed with the patient. Importance of eye exams, self foot care and compliance to office visits was also discussed with the patient.  - CMP14+EGFR - CBC no Diff - Hemoglobin A1c  3. External hemorrhoid - hydrocortisone (PROCTOSOL HC) 2.5 % rectal cream; Place 1 application rectally 2 (two) times daily.  Dispense: 30 g; Refill: 0  4. Hematuria, unspecified type -History of hematuria  -History of kidney stones so will send patient for renal US  - POCT Urinalysis Dipstick (81002) - Culture, Urine - US Renal; Future  5. Leukocytes in urine - Culture, Urine   The patient was encouraged to call or send a message through [place] for any questions or concerns.   Follow up: if symptoms persist or do not get better.   Patient was given opportunity to ask questions. Patient verbalized understanding of the plan and was able to repeat key elements of the plan. All questions were answered to their satisfaction.  Donna Elbert Polyakov, DNP   I, Donna Duarte have reviewed all documentation for this visit. The documentation on 09/15/20 for the exam, diagnosis, procedures, and orders are  all accurate and complete.   Side effects and appropriate use of all the medication(s) were discussed with the patient today. Patient advised to use the medication(s) as directed by their healthcare provider. The patient was encouraged to read, review, and understand all associated package inserts and contact our office with any questions or concerns. The patient accepts the risks of the treatment plan and had an opportunity to ask questions.    IF YOU HAVE BEEN REFERRED TO A SPECIALIST, IT MAY TAKE 1-2 WEEKS TO SCHEDULE/PROCESS THE REFERRAL. IF YOU HAVE NOT HEARD FROM US/SPECIALIST IN TWO WEEKS, PLEASE GIVE [place] A CALL AT [phone].   THE PATIENT IS ENCOURAGED TO PRACTICE SOCIAL DISTANCING DUE TO THE COVID-19 PANDEMIC.

## 2020-09-16 LAB — CMP14+EGFR
ALT: 12 IU/L (ref 0–32)
AST: 13 IU/L (ref 0–40)
Albumin/Globulin Ratio: 1.5 (ref 1.2–2.2)
Albumin: 4.3 g/dL (ref 3.8–4.8)
Alkaline Phosphatase: 97 IU/L (ref 44–121)
BUN/Creatinine Ratio: 12 (ref 12–28)
BUN: 13 mg/dL (ref 8–27)
Bilirubin Total: 0.3 mg/dL (ref 0.0–1.2)
CO2: 21 mmol/L (ref 20–29)
Calcium: 9.6 mg/dL (ref 8.7–10.3)
Chloride: 104 mmol/L (ref 96–106)
Creatinine, Ser: 1.13 mg/dL — ABNORMAL HIGH (ref 0.57–1.00)
Globulin, Total: 2.9 g/dL (ref 1.5–4.5)
Glucose: 84 mg/dL (ref 65–99)
Potassium: 3.8 mmol/L (ref 3.5–5.2)
Sodium: 140 mmol/L (ref 134–144)
Total Protein: 7.2 g/dL (ref 6.0–8.5)
eGFR: 55 mL/min/{1.73_m2} — ABNORMAL LOW (ref >59)

## 2020-09-16 LAB — CBC
Hematocrit: 42.2 % (ref 34.0–46.6)
Hemoglobin: 14.3 g/dL (ref 11.1–15.9)
MCH: 28.9 pg (ref 26.6–33.0)
MCHC: 33.9 g/dL (ref 31.5–35.7)
MCV: 85 fL (ref 79–97)
Platelets: 201 10*3/uL (ref 150–450)
RBC: 4.95 x10E6/uL (ref 3.77–5.28)
RDW: 13 % (ref 11.7–15.4)
WBC: 6.2 10*3/uL (ref 3.4–10.8)

## 2020-09-16 LAB — HEMOGLOBIN A1C
Est. average glucose Bld gHb Est-mCnc: 131 mg/dL
Hgb A1c MFr Bld: 6.2 % — ABNORMAL HIGH (ref 4.8–5.6)

## 2020-09-17 LAB — URINE CULTURE

## 2020-09-22 ENCOUNTER — Telehealth: Payer: Self-pay

## 2020-09-22 NOTE — Telephone Encounter (Signed)
Left the patient a message to call back for lab results. 

## 2020-09-22 NOTE — Telephone Encounter (Signed)
-----   Message from Bary Castilla, NP sent at 09/21/2020  1:25 PM EDT ----- Your urine culture did not show that you have a UTI. Your kidney function is at 54, which is CKD stage 3A. Because of your history of blood in the urine and also history of kidney stones, please up with your kidney specialist. Renal US is already ordered for you. Stay hydrated with a lot of water to help with kidney function. Your blood count is stable. Hgb A1c is down at 6.2 from 6.8. Continue the good work.

## 2020-09-29 ENCOUNTER — Other Ambulatory Visit: Payer: Self-pay | Admitting: Nurse Practitioner

## 2020-09-29 DIAGNOSIS — N1831 Chronic kidney disease, stage 3a: Secondary | ICD-10-CM

## 2020-09-29 NOTE — Progress Notes (Signed)
Referral sent 

## 2020-09-30 ENCOUNTER — Telehealth: Payer: Self-pay

## 2020-09-30 NOTE — Telephone Encounter (Signed)
The pt's voicemail was full, I called the pt to let her know that the referral has been placed for the kidney specialist by Dan Europe, DNP, FNP-BC.

## 2020-09-30 NOTE — Telephone Encounter (Signed)
-----   Message from Bary Castilla, NP sent at 09/29/2020  3:30 PM EDT ----- Referral sent

## 2020-10-06 ENCOUNTER — Ambulatory Visit: Payer: BC Managed Care – PPO | Admitting: Internal Medicine

## 2020-11-08 ENCOUNTER — Other Ambulatory Visit: Payer: Self-pay

## 2020-11-08 ENCOUNTER — Ambulatory Visit
Admission: RE | Admit: 2020-11-08 | Discharge: 2020-11-08 | Disposition: A | Discharge status: Home / Self Care | Payer: BC Managed Care – PPO | Source: Ambulatory Visit | Attending: Nurse Practitioner | Admitting: Nurse Practitioner

## 2020-11-08 DIAGNOSIS — Z87442 Personal history of urinary calculi: Secondary | ICD-10-CM | POA: Diagnosis not present

## 2020-11-08 DIAGNOSIS — R319 Hematuria, unspecified: Secondary | ICD-10-CM | POA: Diagnosis not present

## 2020-11-10 ENCOUNTER — Telehealth: Payer: Self-pay

## 2020-11-10 NOTE — Telephone Encounter (Signed)
I left the pt a message to call the office back for her ultrasound results.

## 2020-11-10 NOTE — Telephone Encounter (Signed)
-----   Message from Bary Castilla, NP sent at 11/09/2020  1:21 PM EDT ----- Your [place] was normal. It did show some mild prominent uterus due to fibroid changes.

## 2020-12-07 ENCOUNTER — Telehealth: Payer: Self-pay

## 2020-12-07 NOTE — Telephone Encounter (Signed)
Patient called and left a voicemail wanting to know the time of her appt on 10/13 she also wanted to know if she could receive a flu vaccine during her visit.  I returned her call and left her a voicemail letting her know her appt is on 10/13 at 3pm and yes she can receive a flu vaccine during her visit. YL,RMA

## 2020-12-31 ENCOUNTER — Other Ambulatory Visit: Payer: Self-pay | Admitting: Internal Medicine

## 2021-01-06 ENCOUNTER — Ambulatory Visit (INDEPENDENT_AMBULATORY_CARE_PROVIDER_SITE_OTHER): Payer: BC Managed Care – PPO | Admitting: Internal Medicine

## 2021-01-06 ENCOUNTER — Other Ambulatory Visit: Payer: Self-pay

## 2021-01-06 ENCOUNTER — Encounter: Payer: Self-pay | Admitting: Internal Medicine

## 2021-01-06 VITALS — BP 136/72 | HR 95 | Temp 98.1°F | Ht 64.8 in | Wt 215.8 lb

## 2021-01-06 DIAGNOSIS — I129 Hypertensive chronic kidney disease with stage 1 through stage 4 chronic kidney disease, or unspecified chronic kidney disease: Secondary | ICD-10-CM | POA: Diagnosis not present

## 2021-01-06 DIAGNOSIS — N1831 Chronic kidney disease, stage 3a: Secondary | ICD-10-CM | POA: Diagnosis not present

## 2021-01-06 DIAGNOSIS — E1122 Type 2 diabetes mellitus with diabetic chronic kidney disease: Secondary | ICD-10-CM

## 2021-01-06 DIAGNOSIS — Z6836 Body mass index (BMI) 36.0-36.9, adult: Secondary | ICD-10-CM

## 2021-01-06 DIAGNOSIS — E049 Nontoxic goiter, unspecified: Secondary | ICD-10-CM

## 2021-01-06 DIAGNOSIS — Z0000 Encounter for general adult medical examination without abnormal findings: Secondary | ICD-10-CM | POA: Diagnosis not present

## 2021-01-06 DIAGNOSIS — E66812 Obesity, class 2: Secondary | ICD-10-CM | POA: Insufficient documentation

## 2021-01-06 NOTE — Progress Notes (Signed)
I,[name],acting as a Education administrator for [name], MD.,have documented all relevant documentation on the behalf of [name], MD,as directed by  [name], MD while in the presence of [name], MD.  This visit occurred during the SARS-CoV-2 public health emergency.  Safety protocols were in place, including screening questions prior to the visit, additional usage of staff PPE, and extensive cleaning of exam room while observing appropriate contact time as indicated for disinfecting solutions.  Subjective:     Patient ID: Donna Duarte , female    DOB: 08/13/1960 , 60 y.o.   MRN: [id number]   Chief Complaint  Patient presents with   Annual Exam   Diabetes   Hypertension    HPI  She is here today for a full physical exam. She is followed by [facility] for her pelvic exams. States she has her mammograms performed there as well. She reports compliance with meds. She denies headaches, chest pain and shortness of breath.   Diabetes She presents for her follow-up diabetic visit. She has type 2 diabetes mellitus. There are no hypoglycemic associated symptoms. Pertinent negatives for hypoglycemia include no headaches. Pertinent negatives for diabetes include no blurred vision and no chest pain. There are no hypoglycemic complications. Risk factors for coronary artery disease include diabetes mellitus, dyslipidemia, hypertension, post-menopausal and sedentary lifestyle. An ACE inhibitor/angiotensin II receptor blocker is being taken.  Hypertension This is a chronic problem. The current episode started more than 1 year ago. The problem has been gradually improving since onset. The problem is controlled. Pertinent negatives include no blurred vision, chest pain, headaches, palpitations or shortness of breath. Risk factors for coronary artery disease include dyslipidemia and obesity. Past treatments include ACE inhibitors. The current treatment provides moderate improvement.    Past  Medical History:  Diagnosis Date   Allergy    Hyperlipidemia    Hypertension    Thyroid disease      Family History  Problem Relation Age of Onset   Hypertension Mother    Diabetes Mother      Current Outpatient Medications:    atorvastatin (LIPITOR) 10 MG tablet, Take 1 tablet (10 mg total) by mouth daily., Disp: 30 tablet, Rfl: 1   eletriptan (RELPAX) 40 MG tablet, Take 1 tablet (40 mg total) by mouth daily as needed for migraine or headache., Disp: 30 tablet, Rfl: 1   hydrocortisone (PROCTOSOL HC) 2.5 % rectal cream, Place 1 application rectally 2 (two) times daily., Disp: 30 g, Rfl: 0   ibuprofen (ADVIL) 800 MG tablet, ibuprofen 800 mg tablet  Take 1 tablet by mouth at 11:00pm on the day prior to procedure, Disp: , Rfl:    Insulin Pen Needle (PEN NEEDLES) 32G X 4 MM MISC, Use as directed with Ozempic pen, Disp: 100 each, Rfl: 1   lisinopril (ZESTRIL) 10 MG tablet, Take 1 tablet (10 mg total) by mouth daily., Disp: 30 tablet, Rfl: 1   Semaglutide,0.25 or 0.5MG/DOS, (OZEMPIC, 0.25 OR 0.5 MG/DOSE,) 2 MG/1.5ML SOPN, INJECT 0.5 MG UNDER THE SKIN ONCE WEEKLY, Disp: 4.5 mL, Rfl: 3   Allergies  Allergen Reactions   Fish Oil Hives and Itching   Penicillins Hives and Swelling   Fentanyl Itching      The patient states she uses post menopausal status for birth control. Last LMP was Patient's last menstrual period was 12/15/2012.. Negative for Dysmenorrhea. Negative for: breast discharge, breast lump(s), breast pain and breast self exam. Associated symptoms include abnormal vaginal bleeding. Pertinent negatives  include abnormal bleeding (hematology), anxiety, decreased libido, depression, difficulty falling sleep, dyspareunia, history of infertility, nocturia, sexual dysfunction, sleep disturbances, urinary incontinence, urinary urgency, vaginal discharge and vaginal itching. Diet regular.The patient states her exercise level is  intermittent.  . The patient's tobacco use is:  Social  History   Tobacco Use  Smoking Status Never  Smokeless Tobacco Never  . She has been exposed to passive smoke. The patient's alcohol use is:  Social History   Substance and Sexual Activity  Alcohol Use Yes    Review of Systems  Constitutional: Negative.   HENT: Negative.    Eyes: Negative.  Negative for blurred vision.  Respiratory: Negative.  Negative for shortness of breath.   Cardiovascular: Negative.  Negative for chest pain and palpitations.  Gastrointestinal: Negative.   Endocrine: Negative.   Genitourinary: Negative.   Musculoskeletal: Negative.   Skin: Negative.   Allergic/Immunologic: Negative.   Neurological: Negative.  Negative for headaches.  Hematological: Negative.   Psychiatric/Behavioral: Negative.      Today's Vitals   01/06/21 1511  BP: 136/72  Pulse: 95  Temp: 98.1 F (36.7 C)  TempSrc: Oral  Weight: 215 lb 12.8 oz (97.9 kg)  Height: 5' 4.8" (1.646 m)   Body mass index is 36.13 kg/m.  Wt Readings from Last 3 Encounters:  01/06/21 215 lb 12.8 oz (97.9 kg)  09/15/20 220 lb 9.6 oz (100.1 kg)  06/22/20 228 lb (103.4 kg)    Objective:  Physical Exam Vitals and nursing note reviewed.  Constitutional:      General: She is not in acute distress.    Appearance: Normal appearance. She is well-developed. She is obese.  HENT:     Head: Normocephalic and atraumatic.     Right Ear: Hearing, tympanic membrane, ear canal and external ear normal. There is no impacted cerumen.     Left Ear: Hearing, tympanic membrane, ear canal and external ear normal. There is no impacted cerumen.     Nose:     Comments: Deferred - masked    Mouth/Throat:     Comments: Deferred - masked Eyes:     General: Lids are normal.     Extraocular Movements: Extraocular movements intact.     Conjunctiva/sclera: Conjunctivae normal.     Pupils: Pupils are equal, round, and reactive to light.     Funduscopic exam:    Right eye: No papilledema.        Left eye: No papilledema.   Neck:     Thyroid: Thyromegaly present.     Vascular: No carotid bruit.  Cardiovascular:     Rate and Rhythm: Normal rate and regular rhythm.     Pulses: Normal pulses.          Dorsalis pedis pulses are 2+ on the right side and 2+ on the left side.     Heart sounds: Normal heart sounds. No murmur heard. Pulmonary:     Effort: Pulmonary effort is normal.     Breath sounds: Normal breath sounds.  Chest:     Chest wall: No mass.  Breasts:    Tanner Score is 5.     Right: Normal. No mass or tenderness.     Left: Normal. No mass or tenderness.  Abdominal:     General: Abdomen is flat. Bowel sounds are normal. There is no distension.     Palpations: Abdomen is soft.     Tenderness: There is no abdominal tenderness.  Musculoskeletal:  General: No swelling. Normal range of motion.     Cervical back: Full passive range of motion without pain, normal range of motion and neck supple.     Right lower leg: No edema.     Left lower leg: No edema.  Feet:     Right foot:     Protective Sensation: 5 sites tested.  5 sites sensed.     Skin integrity: Skin integrity normal.     Toenail Condition: Right toenails are normal.     Left foot:     Protective Sensation: 5 sites tested.  5 sites sensed.     Skin integrity: Skin integrity normal.     Toenail Condition: Left toenails are normal.  Lymphadenopathy:     Upper Body:     Right upper body: No supraclavicular, axillary or pectoral adenopathy.     Left upper body: No supraclavicular, axillary or pectoral adenopathy.  Skin:    General: Skin is warm and dry.     Capillary Refill: Capillary refill takes less than 2 seconds.  Neurological:     General: No focal deficit present.     Mental Status: She is alert and oriented to person, place, and time.     Cranial Nerves: No cranial nerve deficit.     Sensory: No sensory deficit.  Psychiatric:        Mood and Affect: Mood normal.        Behavior: Behavior normal.        Thought  Content: Thought content normal.        Judgment: Judgment normal.        Assessment And Plan:     1. Encounter for annual physical exam Comments: A full exam was performed. Importance of monthly self breast exams was discussed with the patient. PATIENT IS ADVISED TO GET 30-45 MINUTES REGULAR EXERCISE NO LESS THAN FOUR TO FIVE DAYS PER WEEK - BOTH WEIGHTBEARING EXERCISES AND AEROBIC ARE RECOMMENDED.  PATIENT IS ADVISED TO FOLLOW A HEALTHY DIET WITH AT LEAST SIX FRUITS/VEGGIES PER DAY, DECREASE INTAKE OF RED MEAT, AND TO INCREASE FISH INTAKE TO TWO DAYS PER WEEK.  MEATS/FISH SHOULD NOT BE FRIED, BAKED OR BROILED IS PREFERABLE.  IT IS ALSO IMPORTANT TO CUT BACK ON YOUR SUGAR INTAKE. PLEASE AVOID ANYTHING WITH ADDED SUGAR, CORN SYRUP OR OTHER SWEETENERS. IF YOU MUST USE A SWEETENER, YOU CAN TRY STEVIA. IT IS ALSO IMPORTANT TO AVOID ARTIFICIALLY SWEETENERS AND DIET BEVERAGES. LASTLY, I SUGGEST WEARING SPF 50 SUNSCREEN ON EXPOSED PARTS AND ESPECIALLY WHEN IN THE DIRECT SUNLIGHT FOR AN EXTENDED PERIOD OF TIME.  PLEASE AVOID FAST FOOD RESTAURANTS AND INCREASE YOUR WATER INTAKE.  - CBC - Hemoglobin A1c - CMP14+EGFR - Lipid panel  2. Type 2 diabetes mellitus with stage 3a chronic kidney disease, without long-term current use of insulin (HCC) Comments: Diabetic foot exam was performed. She will f/u in 3-4 months for re-evaluation. I DISCUSSED WITH THE PATIENT AT LENGTH REGARDING THE GOALS OF GLYCEMIC CONTROL AND POSSIBLE LONG-TERM COMPLICATIONS.  I  ALSO STRESSED THE IMPORTANCE OF COMPLIANCE WITH HOME GLUCOSE MONITORING, DIETARY RESTRICTIONS INCLUDING AVOIDANCE OF SUGARY DRINKS/PROCESSED FOODS,  ALONG WITH REGULAR EXERCISE.  I  ALSO STRESSED THE IMPORTANCE OF ANNUAL EYE EXAMS, SELF FOOT CARE AND COMPLIANCE WITH OFFICE VISITS.  - Protein electrophoresis, serum - Parathyroid Hormone, Intact w/Ca - Phosphorus  3. 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 Goal BP is less than 130/80.  Encouraged to follow low sodium diet. She will f/u in 4 months for re-evaluation.   4. Goiter Comments: I will check thyroid function and schedule her for thyroid u/s.  - TSH + free T4  5. Class 2 severe obesity due to excess calories with serious comorbidity and body mass index (BMI) of 36.0 to 36.9 in adult [facility]) Comments: She has lost 5 lbs since her last visit. Goal BMI is less than 30 to decrease cardiac risk. Encouraged to aim for at least 150 minutes of exercise per week.   Patient was given opportunity to ask questions. Patient verbalized understanding of the plan and was able to repeat key elements of the plan. All questions were answered to their satisfaction.   I, [name], MD, have reviewed all documentation for this visit. The documentation on 01/08/21 for the exam, diagnosis, procedures, and orders are all accurate and complete.   THE PATIENT IS ENCOURAGED TO PRACTICE SOCIAL DISTANCING DUE TO THE COVID-19 PANDEMIC.

## 2021-01-06 NOTE — Patient Instructions (Signed)
Health Maintenance, Female Adopting a healthy lifestyle and getting preventive care are important in promoting health and wellness. Ask your health care provider about: The right schedule for you to have regular tests and exams. Things you can do on your own to prevent diseases and keep yourself healthy. What should I know about diet, weight, and exercise? Eat a healthy diet  Eat a diet that includes plenty of vegetables, fruits, low-fat dairy products, and lean protein. Do not eat a lot of foods that are high in solid fats, added sugars, or sodium. Maintain a healthy weight Body mass index (BMI) is used to identify weight problems. It estimates body fat based on height and weight. Your health care provider can help determine your BMI and help you achieve or maintain a healthy weight. Get regular exercise Get regular exercise. This is one of the most important things you can do for your health. Most adults should: Exercise for at least 150 minutes each week. The exercise should increase your heart rate and make you sweat (moderate-intensity exercise). Do strengthening exercises at least twice a week. This is in addition to the moderate-intensity exercise. Spend less time sitting. Even light physical activity can be beneficial. Watch cholesterol and blood lipids Have your blood tested for lipids and cholesterol at 62 years of age, then have this test every 5 years. Have your cholesterol levels checked more often if: Your lipid or cholesterol levels are high. You are older than 62 years of age. You are at high risk for heart disease. What should I know about cancer screening? Depending on your health history and family history, you may need to have cancer screening at various ages. This may include screening for: Breast cancer. Cervical cancer. Colorectal cancer. Skin cancer. Lung cancer. What should I know about heart disease, diabetes, and high blood pressure? Blood pressure and heart  disease High blood pressure causes heart disease and increases the risk of stroke. This is more likely to develop in people who have high blood pressure readings, are of African descent, or are overweight. Have your blood pressure checked: Every 3-5 years if you are 18-39 years of age. Every year if you are 40 years old or older. Diabetes Have regular diabetes screenings. This checks your fasting blood sugar level. Have the screening done: Once every three years after age 40 if you are at a normal weight and have a low risk for diabetes. More often and at a younger age if you are overweight or have a high risk for diabetes. What should I know about preventing infection? Hepatitis B If you have a higher risk for hepatitis B, you should be screened for this virus. Talk with your health care provider to find out if you are at risk for hepatitis B infection. Hepatitis C Testing is recommended for: Everyone born from 1945 through 1965. Anyone with known risk factors for hepatitis C. Sexually transmitted infections (STIs) Get screened for STIs, including gonorrhea and chlamydia, if: You are sexually active and are younger than 62 years of age. You are older than 62 years of age and your health care provider tells you that you are at risk for this type of infection. Your sexual activity has changed since you were last screened, and you are at increased risk for chlamydia or gonorrhea. Ask your health care provider if you are at risk. Ask your health care provider about whether you are at high risk for HIV. Your health care provider may recommend a prescription medicine   to help prevent HIV infection. If you choose to take medicine to prevent HIV, you should first get tested for HIV. You should then be tested every 3 months for as long as you are taking the medicine. Pregnancy If you are about to stop having your period (premenopausal) and you may become pregnant, seek counseling before you get  pregnant. Take 400 to 800 micrograms (mcg) of folic acid every day if you become pregnant. Ask for birth control (contraception) if you want to prevent pregnancy. Osteoporosis and menopause Osteoporosis is a disease in which the bones lose minerals and strength with aging. This can result in bone fractures. If you are 65 years old or older, or if you are at risk for osteoporosis and fractures, ask your health care provider if you should: Be screened for bone loss. Take a calcium or vitamin D supplement to lower your risk of fractures. Be given hormone replacement therapy (HRT) to treat symptoms of menopause. Follow these instructions at home: Lifestyle Do not use any products that contain nicotine or tobacco, such as cigarettes, e-cigarettes, and chewing tobacco. If you need help quitting, ask your health care provider. Do not use street drugs. Do not share needles. Ask your health care provider for help if you need support or information about quitting drugs. Alcohol use Do not drink alcohol if: Your health care provider tells you not to drink. You are pregnant, may be pregnant, or are planning to become pregnant. If you drink alcohol: Limit how much you use to 0-1 drink a day. Limit intake if you are breastfeeding. Be aware of how much alcohol is in your drink. In the [place] one drink equals one 12 oz bottle of beer (355 mL), one 5 oz glass of wine (148 mL), or one 1 oz glass of hard liquor (44 mL). General instructions Schedule regular health, dental, and eye exams. Stay current with your vaccines. Tell your health care provider if: You often feel depressed. You have ever been abused or do not feel safe at home. Summary Adopting a healthy lifestyle and getting preventive care are important in promoting health and wellness. Follow your health care provider's instructions about healthy diet, exercising, and getting tested or screened for diseases. Follow your health care provider's  instructions on monitoring your cholesterol and blood pressure. This information is not intended to replace advice given to you by your health care provider. Make sure you discuss any questions you have with your health care provider. Document Revised: 05/21/2020 Document Reviewed: 03/06/2018 Elsevier Patient Education  2022 Elsevier Inc.  

## 2021-01-10 LAB — CMP14+EGFR
ALT: 16 IU/L (ref 0–32)
AST: 14 IU/L (ref 0–40)
Albumin/Globulin Ratio: 1.5 (ref 1.2–2.2)
Albumin: 4.6 g/dL (ref 3.8–4.8)
Alkaline Phosphatase: 106 IU/L (ref 44–121)
BUN/Creatinine Ratio: 10 — ABNORMAL LOW (ref 12–28)
BUN: 10 mg/dL (ref 8–27)
Bilirubin Total: 0.4 mg/dL (ref 0.0–1.2)
CO2: 23 mmol/L (ref 20–29)
Calcium: 9.6 mg/dL (ref 8.7–10.3)
Chloride: 103 mmol/L (ref 96–106)
Creatinine, Ser: 0.96 mg/dL (ref 0.57–1.00)
Globulin, Total: 3 g/dL (ref 1.5–4.5)
Glucose: 94 mg/dL (ref 70–99)
Potassium: 4.1 mmol/L (ref 3.5–5.2)
Sodium: 143 mmol/L (ref 134–144)
Total Protein: 7.6 g/dL (ref 6.0–8.5)
eGFR: 67 mL/min/{1.73_m2} (ref >59)

## 2021-01-10 LAB — PHOSPHORUS: Phosphorus: 3.4 mg/dL (ref 3.0–4.3)

## 2021-01-10 LAB — PROTEIN ELECTROPHORESIS, SERUM
A/G Ratio: 1.1 (ref 0.7–1.7)
Albumin ELP: 3.9 g/dL (ref 2.9–4.4)
Alpha 1: 0.2 g/dL (ref 0.0–0.4)
Alpha 2: 0.8 g/dL (ref 0.4–1.0)
Beta: 1.1 g/dL (ref 0.7–1.3)
Gamma Globulin: 1.5 g/dL (ref 0.4–1.8)
Globulin, Total: 3.7 g/dL (ref 2.2–3.9)

## 2021-01-10 LAB — HEMOGLOBIN A1C
Est. average glucose Bld gHb Est-mCnc: 126 mg/dL
Hgb A1c MFr Bld: 6 % — ABNORMAL HIGH (ref 4.8–5.6)

## 2021-01-10 LAB — TSH+FREE T4
Free T4: 1.42 ng/dL (ref 0.82–1.77)
TSH: 1.5 u[IU]/mL (ref 0.450–4.500)

## 2021-01-10 LAB — LIPID PANEL
Chol/HDL Ratio: 3.6 ratio (ref 0.0–4.4)
Cholesterol, Total: 139 mg/dL (ref 100–199)
HDL: 39 mg/dL — ABNORMAL LOW (ref >39)
LDL Chol Calc (NIH): 84 mg/dL (ref 0–99)
Triglycerides: 84 mg/dL (ref 0–149)
VLDL Cholesterol Cal: 16 mg/dL (ref 5–40)

## 2021-01-10 LAB — CBC
Hematocrit: 44.2 % (ref 34.0–46.6)
Hemoglobin: 14.9 g/dL (ref 11.1–15.9)
MCH: 28.5 pg (ref 26.6–33.0)
MCHC: 33.7 g/dL (ref 31.5–35.7)
MCV: 85 fL (ref 79–97)
Platelets: 226 10*3/uL (ref 150–450)
RBC: 5.23 x10E6/uL (ref 3.77–5.28)
RDW: 12.6 % (ref 11.7–15.4)
WBC: 5.6 10*3/uL (ref 3.4–10.8)

## 2021-01-10 LAB — PTH, INTACT AND CALCIUM: PTH: 49 pg/mL (ref 15–65)

## 2021-01-18 ENCOUNTER — Other Ambulatory Visit: Payer: Self-pay

## 2021-01-18 MED ORDER — OZEMPIC (0.25 OR 0.5 MG/DOSE) 2 MG/1.5ML ~~LOC~~ SOPN: PEN_INJECTOR | SUBCUTANEOUS | 3 refills | Status: DC

## 2021-01-21 ENCOUNTER — Encounter: Payer: Self-pay | Admitting: Internal Medicine

## 2021-01-24 DIAGNOSIS — Z87448 Personal history of other diseases of urinary system: Secondary | ICD-10-CM | POA: Diagnosis not present

## 2021-01-24 DIAGNOSIS — I129 Hypertensive chronic kidney disease with stage 1 through stage 4 chronic kidney disease, or unspecified chronic kidney disease: Secondary | ICD-10-CM | POA: Diagnosis not present

## 2021-01-24 DIAGNOSIS — N200 Calculus of kidney: Secondary | ICD-10-CM | POA: Diagnosis not present

## 2021-01-24 DIAGNOSIS — E119 Type 2 diabetes mellitus without complications: Secondary | ICD-10-CM | POA: Diagnosis not present

## 2021-01-25 DIAGNOSIS — E119 Type 2 diabetes mellitus without complications: Secondary | ICD-10-CM | POA: Diagnosis not present

## 2021-05-12 ENCOUNTER — Ambulatory Visit: Payer: BC Managed Care – PPO | Admitting: Internal Medicine

## 2021-06-07 DIAGNOSIS — E1165 Type 2 diabetes mellitus with hyperglycemia: Secondary | ICD-10-CM | POA: Diagnosis not present

## 2021-06-07 DIAGNOSIS — Z01419 Encounter for gynecological examination (general) (routine) without abnormal findings: Secondary | ICD-10-CM | POA: Diagnosis not present

## 2021-06-07 DIAGNOSIS — H538 Other visual disturbances: Secondary | ICD-10-CM | POA: Diagnosis not present

## 2021-06-07 DIAGNOSIS — Z1239 Encounter for other screening for malignant neoplasm of breast: Secondary | ICD-10-CM | POA: Diagnosis not present

## 2021-06-07 DIAGNOSIS — Z1211 Encounter for screening for malignant neoplasm of colon: Secondary | ICD-10-CM | POA: Diagnosis not present

## 2021-06-07 DIAGNOSIS — Z1231 Encounter for screening mammogram for malignant neoplasm of breast: Secondary | ICD-10-CM | POA: Diagnosis not present

## 2021-06-07 DIAGNOSIS — Z124 Encounter for screening for malignant neoplasm of cervix: Secondary | ICD-10-CM | POA: Diagnosis not present

## 2021-07-31 ENCOUNTER — Other Ambulatory Visit: Payer: Self-pay

## 2021-07-31 ENCOUNTER — Ambulatory Visit (INDEPENDENT_AMBULATORY_CARE_PROVIDER_SITE_OTHER): Payer: BC Managed Care – PPO

## 2021-07-31 ENCOUNTER — Ambulatory Visit (HOSPITAL_COMMUNITY)
Admission: RE | Admit: 2021-07-31 | Discharge: 2021-07-31 | Disposition: A | Discharge status: Home / Self Care | Payer: BC Managed Care – PPO | Source: Ambulatory Visit | Attending: Physician Assistant | Admitting: Physician Assistant

## 2021-07-31 ENCOUNTER — Encounter (HOSPITAL_COMMUNITY): Payer: Self-pay

## 2021-07-31 VITALS — BP 161/99 | HR 110 | Temp 98.3°F | Resp 22

## 2021-07-31 DIAGNOSIS — J189 Pneumonia, unspecified organism: Secondary | ICD-10-CM

## 2021-07-31 DIAGNOSIS — R059 Cough, unspecified: Secondary | ICD-10-CM

## 2021-07-31 MED ORDER — ALBUTEROL SULFATE HFA 108 (90 BASE) MCG/ACT IN AERS
2.0000 | INHALATION_SPRAY | Freq: Once | RESPIRATORY_TRACT | Status: AC
  Administered 2021-07-31: 2 via RESPIRATORY_TRACT

## 2021-07-31 MED ORDER — ALBUTEROL SULFATE HFA 108 (90 BASE) MCG/ACT IN AERS
INHALATION_SPRAY | RESPIRATORY_TRACT | Status: AC
  Filled 2021-07-31: qty 6.7

## 2021-07-31 MED ORDER — LEVOFLOXACIN 750 MG PO TABS: 750.0000 mg | ORAL_TABLET | Freq: Every day | ORAL | 0 refills | Status: DC

## 2021-07-31 NOTE — ED Triage Notes (Signed)
Sore throat started Tuesday, cough started Thursday.  Complains of headache, head and chest congestion, runny nose.  Yellow/light green phlegm and blowing from nose ?

## 2021-07-31 NOTE — Discharge Instructions (Signed)
Your x-ray showed that you might be developing a pneumonia.  We are starting on a very strong antibiotic.  If you develop any muscle pain or joint pain please stop the medication to be seen immediately.  This can raise your blood sugar so please monitor your blood sugar closely.  Use Mucinex, Tylenol, Flonase for symptom relief.  You can also use humidifier for cough.  If anything worsens and you develop high fever, chest pain, shortness of breath, nausea/vomiting you need to be seen immediately.  Follow-up with your primary care provider as scheduled in a few days. ?

## 2021-07-31 NOTE — ED Provider Notes (Addendum)
?[facility] ? ? ? ?CSN: [id number] ?Arrival date & time: 07/31/21  1058 ? ? ?  ? ?History   ?Chief Complaint ?Chief Complaint  ?Patient presents with  ? Cough  ?  chest and nasal congestion and cough - Entered by patient  ? Appointment  ? ? ?HPI ?Donna Duarte is a 61 y.o. female.  ? ?Patient presents today with a weeklong history of URI symptoms.  Reports she initially had a sore throat but that has resolved and she continues to have cough, headache, fatigue, malaise.  She reports that cough has worsened in the past several days prompting evaluation.  Cough is productive with thick purulent sputum.  She denies fever.  She has been taking TheraFlu which has provided significant improvement of symptoms.  Denies any recent antibiotic or steroid use.  She denies history of asthma, COPD, smoking.  She has had COVID vaccines but has not had COVID.  She took an at-home COVID test that was negative at symptom onset.  She is having difficulty with daily activities as a result of symptoms. ? ? ?Past Medical History:  ?Diagnosis Date  ? Allergy   ? Hyperlipidemia   ? Hypertension   ? Thyroid disease   ? ? ?Patient Active Problem List  ? Diagnosis Date Noted  ? Type 2 diabetes mellitus with stage 3a chronic kidney disease, without long-term current use of insulin ([place]) 01/06/2021  ? 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 Stage 3a chronic kidney disease ([place]) 01/06/2021  ? Class 2 severe obesity due to excess calories with serious comorbidity and body mass index (BMI) of 36.0 to 36.9 in adult [facility]) 01/06/2021  ? Multinodular goiter (nontoxic) 11/13/2012  ? ? ?Past Surgical History:  ?Procedure Laterality Date  ? [place]  ? CHOLECYSTECTOMY  2002  ? ENDOMETRIAL ABLATION  2012  ? TUBAL LIGATION  1998  ? ? ?OB History   ?No obstetric history on file. ?  ? ? ? ?Home Medications   ? ?Prior to Admission medications   ?Medication Sig Start Date End Date Taking? Authorizing Provider  ?levofloxacin (LEVAQUIN)  750 MG tablet Take 1 tablet (750 mg total) by mouth daily for 7 days. 07/31/21 08/07/21 Yes Cambren Helm K, PA-C  ?atorvastatin (LIPITOR) 10 MG tablet Take 1 tablet (10 mg total) by mouth daily. 06/28/20   Glendale Chard, MD  ?eletriptan (RELPAX) 40 MG tablet Take 1 tablet (40 mg total) by mouth daily as needed for migraine or headache. 07/14/20   Glendale Chard, MD  ?hydrocortisone (PROCTOSOL HC) 2.5 % rectal cream Place 1 application rectally 2 (two) times daily. 09/15/20   Bary Castilla, NP  ?ibuprofen (ADVIL) 800 MG tablet ibuprofen 800 mg tablet ? Take 1 tablet by mouth at 11:00pm on the day prior to procedure    [provider]  ?Insulin Pen Needle (PEN NEEDLES) 32G X 4 MM MISC Use as directed with Ozempic pen 07/14/20   Glendale Chard, MD  ?lisinopril (ZESTRIL) 10 MG tablet Take 1 tablet (10 mg total) by mouth daily. 06/28/20   Glendale Chard, MD  ?Semaglutide,0.25 or 0.'5MG'$ /DOS, (OZEMPIC, 0.25 OR 0.5 MG/DOSE,) 2 MG/1.5ML SOPN INJECT 0.5 MG UNDER THE SKIN ONCE WEEKLY 01/18/21   Glendale Chard, MD  ? ? ?Family History ?Family History  ?Problem Relation Age of Onset  ? Hypertension Mother   ? Diabetes Mother   ? ? ?Social History ?Social History  ? ?Tobacco Use  ? Smoking status: Never  ? Smokeless tobacco: Never  ?  Vaping Use  ? Vaping Use: Never used  ?Substance Use Topics  ? Alcohol use: Yes  ? Drug use: No  ? ? ? ?Allergies   ?Fish oil, Penicillins, and Fentanyl ? ? ?Review of Systems ?Review of Systems  ?Constitutional:  Positive for activity change and fatigue. Negative for appetite change and fever.  ?HENT:  Positive for congestion. Negative for sinus pressure, sneezing and sore throat (Improved).   ?Respiratory:  Positive for cough. Negative for shortness of breath.   ?Cardiovascular:  Negative for chest pain.  ?Gastrointestinal:  Negative for abdominal pain, diarrhea, nausea and vomiting.  ?Neurological:  Negative for dizziness, light-headedness and headaches.  ? ? ?Physical Exam ?Triage Vital  Signs ?ED Triage Vitals  ?Enc Vitals Group  ?   BP 07/31/21 1131 (!) 161/99  ?   Pulse Rate 07/31/21 1131 (!) 110  ?   Resp 07/31/21 1131 (!) 22  ?   Temp 07/31/21 1131 98.3 ?F (36.8 ?C)  ?   Temp Source 07/31/21 1131 Oral  ?   SpO2 07/31/21 1131 97 %  ?   Weight --   ?   Height --   ?   Head Circumference --   ?   Peak Flow --   ?   Pain Score 07/31/21 1126 0  ?   Pain Loc --   ?   Pain Edu? --   ?   Excl. in [place]? --   ? ?No data found. ? ?Updated Vital Signs ?BP (!) 161/99 (BP Location: Left Arm) Comment: has not had medicine today Comment (BP Location): large cuff  Pulse (!) 110   Temp 98.3 ?F (36.8 ?C) (Oral)   Resp (!) 22   LMP 12/15/2012   SpO2 97%  ? ?Visual Acuity ?Right Eye Distance:   ?Left Eye Distance:   ?Bilateral Distance:   ? ?Right Eye Near:   ?Left Eye Near:    ?Bilateral Near:    ? ?Physical Exam ?Vitals reviewed.  ?Constitutional:   ?   General: She is awake. She is not in acute distress. ?   Appearance: Normal appearance. She is well-developed. She is not ill-appearing.  ?   Comments: Very pleasant female appears stated age in no acute distress sitting comfortably in exam room  ?HENT:  ?   Head: Normocephalic and atraumatic.  ?   Right Ear: Tympanic membrane, ear canal and external ear normal. Tympanic membrane is not erythematous or bulging.  ?   Left Ear: Tympanic membrane, ear canal and external ear normal. Tympanic membrane is not erythematous or bulging.  ?   Nose:  ?   Right Sinus: No maxillary sinus tenderness or frontal sinus tenderness.  ?   Left Sinus: No maxillary sinus tenderness or frontal sinus tenderness.  ?   Mouth/Throat:  ?   Pharynx: Uvula midline. Posterior oropharyngeal erythema present. No oropharyngeal exudate.  ?Cardiovascular:  ?   Rate and Rhythm: Normal rate and regular rhythm.  ?   Heart sounds: Normal heart sounds, S1 normal and S2 normal. No murmur heard. ?Pulmonary:  ?   Effort: Pulmonary effort is normal.  ?   Breath sounds: Wheezing present. No rhonchi or  rales.  ?   Comments: Widespread wheezing ?Psychiatric:     ?   Behavior: Behavior is cooperative.  ? ? ? ?UC Treatments / Results  ?Labs ?(all labs ordered are listed, but only abnormal results are displayed) ?Labs Reviewed - No data to display ? ?EKG ? ? ?  Radiology ?DG Chest 2 View ? ?Result Date: 07/31/2021 ?CLINICAL DATA:  Worsening cough. EXAM: CHEST - 2 VIEW COMPARISON:  None Available. FINDINGS: Mild opacity in the right base. The heart, hila, mediastinum, lungs, and pleura are otherwise unremarkable. IMPRESSION: Mild opacity in the right base could represent developing pneumonia. Recommend short-term follow-up imaging after treatment to ensure resolution. Electronically Signed   By: Dorise Bullion III M.D.   On: 07/31/2021 12:27   ? ?Procedures ?Procedures (including critical care time) ? ?Medications Ordered in UC ?Medications  ?albuterol (VENTOLIN HFA) 108 (90 Base) MCG/ACT inhaler 2 puff (2 puffs Inhalation Given 07/31/21 1200)  ? ? ?Initial Impression / Assessment and Plan / UC Course  ?I have reviewed the triage vital signs and the nursing notes. ? ?Pertinent labs & imaging results that were available during my care of the patient were reviewed by me and considered in my medical decision making (see chart for details). ? ?  ? ?No indication for viral testing as patient has been symptomatic for approximately a week and this would not change management.  She was given albuterol with resolution of wheezing.  Chest x-ray was obtained that showed mild opacity right base concerning for developing pneumonia.  Based on [address] patient does not require inpatient treatment.  She has hypersensitivity reaction (hives) to penicillins so we will use respiratory fluoroquinolone.  Discussed risks with this medication including tendon rupture and hypoglycemia.  Discussed that if he develops any side effects she should return immediately for reevaluation.  She has a scheduled appointment with her PCP in 48 hours social  encouraged to keep this appointment.  Can use over-the-counter medication for symptom management.  Discussed that if she has any worsening symptoms including fever, chest pain, shortness of breath, worsening cough, let

## 2021-08-03 ENCOUNTER — Ambulatory Visit (INDEPENDENT_AMBULATORY_CARE_PROVIDER_SITE_OTHER): Payer: BC Managed Care – PPO | Admitting: Internal Medicine

## 2021-08-03 ENCOUNTER — Telehealth: Payer: Self-pay

## 2021-08-03 ENCOUNTER — Encounter: Payer: Self-pay | Admitting: Internal Medicine

## 2021-08-03 ENCOUNTER — Telehealth: Payer: BC Managed Care – PPO | Admitting: Internal Medicine

## 2021-08-03 VITALS — BP 142/90 | HR 90 | Temp 98.0°F | Resp 22 | Ht 64.8 in | Wt 215.0 lb

## 2021-08-03 DIAGNOSIS — R82998 Other abnormal findings in urine: Secondary | ICD-10-CM

## 2021-08-03 DIAGNOSIS — E1122 Type 2 diabetes mellitus with diabetic chronic kidney disease: Secondary | ICD-10-CM | POA: Diagnosis not present

## 2021-08-03 DIAGNOSIS — J189 Pneumonia, unspecified organism: Secondary | ICD-10-CM | POA: Diagnosis not present

## 2021-08-03 DIAGNOSIS — I129 Hypertensive chronic kidney disease with stage 1 through stage 4 chronic kidney disease, or unspecified chronic kidney disease: Secondary | ICD-10-CM | POA: Diagnosis not present

## 2021-08-03 DIAGNOSIS — N1831 Chronic kidney disease, stage 3a: Secondary | ICD-10-CM | POA: Diagnosis not present

## 2021-08-03 NOTE — Patient Instructions (Signed)

## 2021-08-03 NOTE — Progress Notes (Signed)
?[name],acting as a Education administrator for [name], MD.,have documented all relevant documentation on the behalf of [name], MD,as directed by  [name], MD while in the presence of [name], MD.  ?This visit occurred during the SARS-CoV-2 public health emergency.  Safety protocols were in place, including screening questions prior to the visit, additional usage of staff PPE, and extensive cleaning of exam room while observing appropriate contact time as indicated for disinfecting solutions. ? ?Subjective:  ?  ? Patient ID: Donna Duarte , female    DOB: 08/29/1960 , 61 y.o.   MRN: [id number] ? ? ?Chief Complaint  ?Patient presents with  ? Diabetes  ? Hypertension  ? Pneumonia  ? ? ?HPI ? ?Patient is here for DM and HTN check. She reports compliance with meds. She was recently diagnosed with PNA on 5/7, prescribed Levaquin. She states she is starting to feel better. She does admit to feeling a little tired today, she took her husband to his appts today  ? ?Diabetes ?She presents for her follow-up diabetic visit. She has type 2 diabetes mellitus. There are no hypoglycemic associated symptoms. Pertinent negatives for diabetes include no blurred vision, no polydipsia, no polyphagia and no polyuria. There are no hypoglycemic complications. Risk factors for coronary artery disease include diabetes mellitus, dyslipidemia, hypertension, post-menopausal and sedentary lifestyle. An ACE inhibitor/angiotensin II receptor blocker is being taken.  ?Hypertension ?This is a chronic problem. The current episode started more than 1 year ago. The problem has been gradually improving since onset. The problem is controlled. Pertinent negatives include no blurred vision or palpitations. Risk factors for coronary artery disease include dyslipidemia and obesity. Past treatments include ACE inhibitors. The current treatment provides moderate improvement.   ? ?Past Medical History:  ?Diagnosis Date  ? Allergy    ? Diabetes mellitus without complication ([place])   ? Hyperlipidemia   ? Hypertension   ? Thyroid disease   ?  ? ?Family History  ?Problem Relation Age of Onset  ? Hypertension Mother   ? Diabetes Mother   ? ? ? ?Current Outpatient Medications:  ?  albuterol (VENTOLIN HFA) 108 (90 Base) MCG/ACT inhaler, Inhale into the lungs every 6 (six) hours as needed for wheezing or shortness of breath., Disp: , Rfl:  ?  atorvastatin (LIPITOR) 10 MG tablet, Take 1 tablet (10 mg total) by mouth daily., Disp: 30 tablet, Rfl: 1 ?  Cholecalciferol (VITAMIN D3 PO), Take 1 tablet by mouth daily., Disp: , Rfl:  ?  Insulin Pen Needle (PEN NEEDLES) 32G X 4 MM MISC, Use as directed with Ozempic pen, Disp: 100 each, Rfl: 1 ?  lisinopril (ZESTRIL) 10 MG tablet, Take 1 tablet (10 mg total) by mouth daily., Disp: 30 tablet, Rfl: 1 ?  Multiple Vitamins-Minerals (ZINC PO), Take 1 tablet by mouth daily., Disp: , Rfl:  ?  Semaglutide,0.25 or 0.5MG/DOS, (OZEMPIC, 0.25 OR 0.5 MG/DOSE,) 2 MG/1.5ML SOPN, INJECT 0.5 MG UNDER THE SKIN ONCE WEEKLY (Patient taking differently: Inject 0.5 mg into the skin once a week. Tuesday), Disp: 4.5 mL, Rfl: 3  ? ?Allergies  ?Allergen Reactions  ? Fish Oil Hives and Itching  ? Penicillins Hives and Swelling  ? Fentanyl Itching  ?  ? ?Review of Systems  ?Constitutional: Negative.   ?Eyes:  Negative for blurred vision.  ?Respiratory:  Positive for cough.   ?Cardiovascular: Negative.  Negative for palpitations.  ?Endocrine: Negative for polydipsia, polyphagia and polyuria.  ?Neurological: Negative.   ?Psychiatric/Behavioral:  Negative.     ? ?Today's Vitals  ? 08/03/21 1635  ?BP: (!) 142/90  ?Pulse: 90  ?Resp: (!) 22  ?Temp: 98 ?F (36.7 ?C)  ?TempSrc: Oral  ?Weight: 215 lb (97.5 kg)  ?Height: 5' 4.8" (1.646 m)  ? ?Body mass index is 36 kg/m?.  ? ?Objective:  ?Physical Exam ?Vitals and nursing note reviewed.  ?Constitutional:   ?   Appearance: Normal appearance.  ?HENT:  ?   Head: Normocephalic and atraumatic.  ?Eyes:  ?    Extraocular Movements: Extraocular movements intact.  ?Cardiovascular:  ?   Rate and Rhythm: Normal rate and regular rhythm.  ?   Heart sounds: Normal heart sounds.  ?Pulmonary:  ?   Effort: Pulmonary effort is normal.  ?   Breath sounds: Wheezing present.  ?Musculoskeletal:  ?   Cervical back: Normal range of motion.  ?Skin: ?   General: Skin is warm.  ?Neurological:  ?   General: No focal deficit present.  ?   Mental Status: She is alert.  ?Psychiatric:     ?   Mood and Affect: Mood normal.     ?   Behavior: Behavior normal.  ?    ?Assessment And Plan:  ?   ?1. Type 2 diabetes mellitus with stage 3a chronic kidney disease, without long-term current use of insulin ([place]) ?Comments: Chronic, I will check labs as below. Importance of dietary/medication compliance was d/w patient. She is also encouraged to schedule eye exam.  ?- CMP14+EGFR ?- Hemoglobin A1c ?- Urine Albumin-Creatinine with uACR ? ?2. Parenchymal renal hypertension, stage 1 through stage 4 or unspecified chronic kidney disease ?Comments: Chronic, uncontrolled. LIkely exacerbated by acute illness, I will not make any med changes today.  ? ?3. Pneumonia due to infectious organism, unspecified laterality, unspecified part of lung ?Comments: Diagnosed on 5/7, notes reviewed. She is encouraged to complete full course of abx, stay hydrated and use inhaler at least twice daily.  ? ?4. Urine white blood cells increased ?- Urine Culture ?  ?Patient was given opportunity to ask questions. Patient verbalized understanding of the plan and was able to repeat key elements of the plan. All questions were answered to their satisfaction.  ? ?I, [name], MD, have reviewed all documentation for this visit. The documentation on 08/03/21 for the exam, diagnosis, procedures, and orders are all accurate and complete.  ? ?IF YOU HAVE BEEN REFERRED TO A SPECIALIST, IT MAY TAKE 1-2 WEEKS TO SCHEDULE/PROCESS THE REFERRAL. IF YOU HAVE NOT HEARD FROM US/SPECIALIST IN TWO  WEEKS, PLEASE GIVE [place] A CALL AT [phone].  ? ?THE PATIENT IS ENCOURAGED TO PRACTICE SOCIAL DISTANCING DUE TO THE COVID-19 PANDEMIC.   ?

## 2021-08-03 NOTE — Telephone Encounter (Signed)
Transition Care Management Unsuccessful Follow-up Telephone Call ? ?Date of discharge and from where:  07/31/21 [facility] ? ?Attempts:  1st Attempt ? ?Reason for unsuccessful TCM follow-up call:  Left voice message ? ?  ?

## 2021-08-04 ENCOUNTER — Other Ambulatory Visit: Payer: BC Managed Care – PPO

## 2021-08-04 DIAGNOSIS — R82998 Other abnormal findings in urine: Secondary | ICD-10-CM | POA: Diagnosis not present

## 2021-08-04 LAB — CMP14+EGFR
ALT: 18 IU/L (ref 0–32)
AST: 16 IU/L (ref 0–40)
Albumin/Globulin Ratio: 1.5 (ref 1.2–2.2)
Albumin: 4.4 g/dL (ref 3.8–4.8)
Alkaline Phosphatase: 106 IU/L (ref 44–121)
BUN/Creatinine Ratio: 12 (ref 12–28)
BUN: 11 mg/dL (ref 8–27)
Bilirubin Total: 0.3 mg/dL (ref 0.0–1.2)
CO2: 24 mmol/L (ref 20–29)
Calcium: 9.4 mg/dL (ref 8.7–10.3)
Chloride: 105 mmol/L (ref 96–106)
Creatinine, Ser: 0.94 mg/dL (ref 0.57–1.00)
Globulin, Total: 2.9 g/dL (ref 1.5–4.5)
Glucose: 108 mg/dL — ABNORMAL HIGH (ref 70–99)
Potassium: 4 mmol/L (ref 3.5–5.2)
Sodium: 142 mmol/L (ref 134–144)
Total Protein: 7.3 g/dL (ref 6.0–8.5)
eGFR: 69 mL/min/{1.73_m2} (ref >59)

## 2021-08-04 LAB — HEMOGLOBIN A1C
Est. average glucose Bld gHb Est-mCnc: 126 mg/dL
Hgb A1c MFr Bld: 6 % — ABNORMAL HIGH (ref 4.8–5.6)

## 2021-08-05 ENCOUNTER — Other Ambulatory Visit: Payer: Self-pay

## 2021-08-05 ENCOUNTER — Emergency Department (HOSPITAL_COMMUNITY): Payer: BC Managed Care – PPO

## 2021-08-05 ENCOUNTER — Encounter (HOSPITAL_COMMUNITY): Payer: Self-pay

## 2021-08-05 ENCOUNTER — Observation Stay (HOSPITAL_COMMUNITY)
Admission: EM | Admit: 2021-08-05 | Discharge: 2021-08-06 | Disposition: A | Discharge status: Home / Self Care | Payer: BC Managed Care – PPO | Attending: Internal Medicine | Admitting: Internal Medicine

## 2021-08-05 DIAGNOSIS — Z79899 Other long term (current) drug therapy: Secondary | ICD-10-CM | POA: Diagnosis not present

## 2021-08-05 DIAGNOSIS — N1831 Chronic kidney disease, stage 3a: Secondary | ICD-10-CM | POA: Insufficient documentation

## 2021-08-05 DIAGNOSIS — E1122 Type 2 diabetes mellitus with diabetic chronic kidney disease: Secondary | ICD-10-CM | POA: Diagnosis not present

## 2021-08-05 DIAGNOSIS — I82409 Acute embolism and thrombosis of unspecified deep veins of unspecified lower extremity: Secondary | ICD-10-CM | POA: Insufficient documentation

## 2021-08-05 DIAGNOSIS — J189 Pneumonia, unspecified organism: Principal | ICD-10-CM | POA: Insufficient documentation

## 2021-08-05 DIAGNOSIS — I10 Essential (primary) hypertension: Secondary | ICD-10-CM | POA: Diagnosis present

## 2021-08-05 DIAGNOSIS — Z20822 Contact with and (suspected) exposure to covid-19: Secondary | ICD-10-CM | POA: Diagnosis not present

## 2021-08-05 DIAGNOSIS — R000 Tachycardia, unspecified: Secondary | ICD-10-CM | POA: Diagnosis not present

## 2021-08-05 DIAGNOSIS — Z7985 Long-term (current) use of injectable non-insulin antidiabetic drugs: Secondary | ICD-10-CM | POA: Diagnosis not present

## 2021-08-05 DIAGNOSIS — R0602 Shortness of breath: Secondary | ICD-10-CM | POA: Diagnosis not present

## 2021-08-05 DIAGNOSIS — R509 Fever, unspecified: Secondary | ICD-10-CM | POA: Insufficient documentation

## 2021-08-05 DIAGNOSIS — R6510 Systemic inflammatory response syndrome (SIRS) of non-infectious origin without acute organ dysfunction: Secondary | ICD-10-CM | POA: Insufficient documentation

## 2021-08-05 DIAGNOSIS — I129 Hypertensive chronic kidney disease with stage 1 through stage 4 chronic kidney disease, or unspecified chronic kidney disease: Secondary | ICD-10-CM | POA: Insufficient documentation

## 2021-08-05 HISTORY — DX: Type 2 diabetes mellitus without complications: E11.9

## 2021-08-05 LAB — CBC WITH DIFFERENTIAL/PLATELET
Abs Immature Granulocytes: 0.07 10*3/uL (ref 0.00–0.07)
Basophils Absolute: 0 10*3/uL (ref 0.0–0.1)
Basophils Relative: 0 %
Eosinophils Absolute: 0.2 10*3/uL (ref 0.0–0.5)
Eosinophils Relative: 4 %
HCT: 42 % (ref 36.0–46.0)
Hemoglobin: 14.2 g/dL (ref 12.0–15.0)
Immature Granulocytes: 2 %
Lymphocytes Relative: 7 %
Lymphs Abs: 0.3 10*3/uL — ABNORMAL LOW (ref 0.7–4.0)
MCH: 28.9 pg (ref 26.0–34.0)
MCHC: 33.8 g/dL (ref 30.0–36.0)
MCV: 85.5 fL (ref 80.0–100.0)
Monocytes Absolute: 0.5 10*3/uL (ref 0.1–1.0)
Monocytes Relative: 10 %
Neutro Abs: 3.7 10*3/uL (ref 1.7–7.7)
Neutrophils Relative %: 77 %
Platelets: 153 10*3/uL (ref 150–400)
RBC: 4.91 MIL/uL (ref 3.87–5.11)
RDW: 12.8 % (ref 11.5–15.5)
WBC: 4.8 10*3/uL (ref 4.0–10.5)
nRBC: 0 % (ref 0.0–0.2)

## 2021-08-05 LAB — RESP PANEL BY RT-PCR (FLU A&B, COVID) ARPGX2
Influenza A by PCR: NEGATIVE
Influenza B by PCR: NEGATIVE
SARS Coronavirus 2 by RT PCR: NEGATIVE

## 2021-08-05 NOTE — ED Provider Triage Note (Signed)
Emergency Medicine Provider Triage Evaluation Note ? ?Georgana Romain , a 61 y.o. female  was evaluated in triage.   Recent dx PNA 07/31/21 at UC, RX with Levaquin. Now with fever, myalgias and SOB. Compliant with meds ?Review of Systems  ?Positive: Fever, sob. cough ?Negative:  ? ?Physical Exam  ?BP (!) 168/96   Pulse (!) 121   Temp (!) 102.3 ?F (39.1 ?C) (Oral)   Resp (!) 21   LMP 12/15/2012   SpO2 94%  ?Gen:   Awake, no distress   ?Resp:  Normal effort  ?MSK:   Moves extremities without difficulty  ?Other:   ? ?Medical Decision Making  ?Medically screening exam initiated at 11:24 PM.  Appropriate orders placed.  Roniqua Kintz was informed that the remainder of the evaluation will be completed by another provider, this initial triage assessment does not replace that evaluation, and the importance of remaining in the ED until their evaluation is complete. ? ?PNA, failed outpatient abx ? ?Meets Sepsis criteria, Nursing aware patient needs room in back ?  ?Styles Fambro A, PA-C ?08/05/21 2326 ? ?

## 2021-08-05 NOTE — ED Triage Notes (Addendum)
Pt reports being diagnosed with PNA on May 7th, pt continues to worsen and now has a fever, generalized body aches, and SHOB.  ?

## 2021-08-06 ENCOUNTER — Encounter: Payer: Self-pay | Admitting: Internal Medicine

## 2021-08-06 ENCOUNTER — Observation Stay (HOSPITAL_BASED_OUTPATIENT_CLINIC_OR_DEPARTMENT_OTHER): Payer: BC Managed Care – PPO

## 2021-08-06 ENCOUNTER — Other Ambulatory Visit (HOSPITAL_COMMUNITY): Payer: Self-pay

## 2021-08-06 ENCOUNTER — Encounter (HOSPITAL_COMMUNITY): Payer: Self-pay | Admitting: Internal Medicine

## 2021-08-06 ENCOUNTER — Observation Stay (HOSPITAL_COMMUNITY): Payer: BC Managed Care – PPO

## 2021-08-06 DIAGNOSIS — E785 Hyperlipidemia, unspecified: Secondary | ICD-10-CM | POA: Diagnosis not present

## 2021-08-06 DIAGNOSIS — E1122 Type 2 diabetes mellitus with diabetic chronic kidney disease: Secondary | ICD-10-CM | POA: Diagnosis not present

## 2021-08-06 DIAGNOSIS — M79604 Pain in right leg: Secondary | ICD-10-CM | POA: Diagnosis not present

## 2021-08-06 DIAGNOSIS — I82409 Acute embolism and thrombosis of unspecified deep veins of unspecified lower extremity: Secondary | ICD-10-CM

## 2021-08-06 DIAGNOSIS — N1832 Chronic kidney disease, stage 3b: Secondary | ICD-10-CM | POA: Diagnosis not present

## 2021-08-06 DIAGNOSIS — M79605 Pain in left leg: Secondary | ICD-10-CM

## 2021-08-06 DIAGNOSIS — R059 Cough, unspecified: Secondary | ICD-10-CM | POA: Diagnosis not present

## 2021-08-06 DIAGNOSIS — I129 Hypertensive chronic kidney disease with stage 1 through stage 4 chronic kidney disease, or unspecified chronic kidney disease: Secondary | ICD-10-CM | POA: Diagnosis not present

## 2021-08-06 DIAGNOSIS — R6510 Systemic inflammatory response syndrome (SIRS) of non-infectious origin without acute organ dysfunction: Secondary | ICD-10-CM

## 2021-08-06 DIAGNOSIS — R0602 Shortness of breath: Secondary | ICD-10-CM | POA: Diagnosis not present

## 2021-08-06 DIAGNOSIS — I82451 Acute embolism and thrombosis of right peroneal vein: Secondary | ICD-10-CM

## 2021-08-06 DIAGNOSIS — I10 Essential (primary) hypertension: Secondary | ICD-10-CM | POA: Diagnosis present

## 2021-08-06 LAB — COMPREHENSIVE METABOLIC PANEL
ALT: 17 U/L (ref 0–44)
AST: 16 U/L (ref 15–41)
Albumin: 4 g/dL (ref 3.5–5.0)
Alkaline Phosphatase: 87 U/L (ref 38–126)
Anion gap: 6 (ref 5–15)
BUN: 11 mg/dL (ref 8–23)
CO2: 24 mmol/L (ref 22–32)
Calcium: 8.8 mg/dL — ABNORMAL LOW (ref 8.9–10.3)
Chloride: 106 mmol/L (ref 98–111)
Creatinine, Ser: 1.06 mg/dL — ABNORMAL HIGH (ref 0.44–1.00)
GFR, Estimated: 59 mL/min — ABNORMAL LOW (ref >60)
Glucose, Bld: 138 mg/dL — ABNORMAL HIGH (ref 70–99)
Potassium: 3.5 mmol/L (ref 3.5–5.1)
Sodium: 136 mmol/L (ref 135–145)
Total Bilirubin: 0.5 mg/dL (ref 0.3–1.2)
Total Protein: 7.9 g/dL (ref 6.5–8.1)

## 2021-08-06 LAB — CBC WITH DIFFERENTIAL/PLATELET
Abs Immature Granulocytes: 0.08 10*3/uL — ABNORMAL HIGH (ref 0.00–0.07)
Basophils Absolute: 0 10*3/uL (ref 0.0–0.1)
Basophils Relative: 1 %
Eosinophils Absolute: 0.2 10*3/uL (ref 0.0–0.5)
Eosinophils Relative: 4 %
HCT: 39.3 % (ref 36.0–46.0)
Hemoglobin: 13.6 g/dL (ref 12.0–15.0)
Immature Granulocytes: 2 %
Lymphocytes Relative: 13 %
Lymphs Abs: 0.5 10*3/uL — ABNORMAL LOW (ref 0.7–4.0)
MCH: 29.2 pg (ref 26.0–34.0)
MCHC: 34.6 g/dL (ref 30.0–36.0)
MCV: 84.5 fL (ref 80.0–100.0)
Monocytes Absolute: 0.5 10*3/uL (ref 0.1–1.0)
Monocytes Relative: 12 %
Neutro Abs: 2.7 10*3/uL (ref 1.7–7.7)
Neutrophils Relative %: 68 %
Platelets: 148 10*3/uL — ABNORMAL LOW (ref 150–400)
RBC: 4.65 MIL/uL (ref 3.87–5.11)
RDW: 12.9 % (ref 11.5–15.5)
WBC: 4 10*3/uL (ref 4.0–10.5)
nRBC: 0 % (ref 0.0–0.2)

## 2021-08-06 LAB — URINE CULTURE: Organism ID, Bacteria: NO GROWTH

## 2021-08-06 LAB — PROTIME-INR
INR: 1 (ref 0.8–1.2)
Prothrombin Time: 13 seconds (ref 11.4–15.2)

## 2021-08-06 LAB — BASIC METABOLIC PANEL
Anion gap: 9 (ref 5–15)
BUN: 10 mg/dL (ref 8–23)
CO2: 22 mmol/L (ref 22–32)
Calcium: 8.5 mg/dL — ABNORMAL LOW (ref 8.9–10.3)
Chloride: 104 mmol/L (ref 98–111)
Creatinine, Ser: 0.88 mg/dL (ref 0.44–1.00)
GFR, Estimated: >60 mL/min (ref >60)
Glucose, Bld: 102 mg/dL — ABNORMAL HIGH (ref 70–99)
Potassium: 3.3 mmol/L — ABNORMAL LOW (ref 3.5–5.1)
Sodium: 135 mmol/L (ref 135–145)

## 2021-08-06 LAB — URINALYSIS, ROUTINE W REFLEX MICROSCOPIC
Bilirubin Urine: NEGATIVE
Glucose, UA: NEGATIVE mg/dL
Ketones, ur: NEGATIVE mg/dL
Nitrite: NEGATIVE
Protein, ur: NEGATIVE mg/dL
Specific Gravity, Urine: 1.018 (ref 1.005–1.030)
WBC, UA: >50 WBC/hpf — ABNORMAL HIGH (ref 0–5)
pH: 5 (ref 5.0–8.0)

## 2021-08-06 LAB — PROCALCITONIN: Procalcitonin: <0.1 ng/mL

## 2021-08-06 LAB — HEPATIC FUNCTION PANEL
ALT: 16 U/L (ref 0–44)
AST: 16 U/L (ref 15–41)
Albumin: 3.6 g/dL (ref 3.5–5.0)
Alkaline Phosphatase: 70 U/L (ref 38–126)
Bilirubin, Direct: <0.1 mg/dL (ref 0.0–0.2)
Total Bilirubin: 0.5 mg/dL (ref 0.3–1.2)
Total Protein: 7.3 g/dL (ref 6.5–8.1)

## 2021-08-06 LAB — HIV ANTIBODY (ROUTINE TESTING W REFLEX): HIV Screen 4th Generation wRfx: NONREACTIVE

## 2021-08-06 LAB — LACTIC ACID, PLASMA
Lactic Acid, Venous: 0.8 mmol/L (ref 0.5–1.9)
Lactic Acid, Venous: 1 mmol/L (ref 0.5–1.9)

## 2021-08-06 LAB — CBG MONITORING, ED
Glucose-Capillary: 112 mg/dL — ABNORMAL HIGH (ref 70–99)
Glucose-Capillary: 78 mg/dL (ref 70–99)

## 2021-08-06 LAB — CK: Total CK: 272 U/L — ABNORMAL HIGH (ref 38–234)

## 2021-08-06 LAB — SEDIMENTATION RATE: Sed Rate: 31 mm/hr — ABNORMAL HIGH (ref 0–22)

## 2021-08-06 MED ORDER — APIXABAN 5 MG PO TABS: 5.0000 mg | ORAL_TABLET | Freq: Two times a day (BID) | ORAL | Status: DC

## 2021-08-06 MED ORDER — SODIUM CHLORIDE 0.9 % IV SOLN
500.0000 mg | Freq: Once | INTRAVENOUS | Status: AC
  Administered 2021-08-06: 500 mg via INTRAVENOUS
  Filled 2021-08-06: qty 5

## 2021-08-06 MED ORDER — ACETAMINOPHEN 325 MG PO TABS: 650.0000 mg | ORAL_TABLET | Freq: Four times a day (QID) | ORAL | Status: DC | PRN

## 2021-08-06 MED ORDER — SODIUM CHLORIDE 0.9 % IV SOLN
2.0000 g | Freq: Once | INTRAVENOUS | Status: AC
  Administered 2021-08-06: 2 g via INTRAVENOUS
  Filled 2021-08-06: qty 20

## 2021-08-06 MED ORDER — LISINOPRIL 10 MG PO TABS
10.0000 mg | ORAL_TABLET | Freq: Every day | ORAL | Status: DC
Start: 2021-08-06 — End: 2021-08-06
  Administered 2021-08-06: 10 mg via ORAL
  Filled 2021-08-06: qty 1

## 2021-08-06 MED ORDER — ATORVASTATIN CALCIUM 10 MG PO TABS
10.0000 mg | ORAL_TABLET | Freq: Every day | ORAL | Status: DC
Start: 2021-08-06 — End: 2021-08-06
  Administered 2021-08-06: 10 mg via ORAL
  Filled 2021-08-06: qty 1

## 2021-08-06 MED ORDER — LACTATED RINGERS IV SOLN: INTRAVENOUS | Status: DC

## 2021-08-06 MED ORDER — INSULIN ASPART 100 UNIT/ML IJ SOLN
0.0000 [IU] | Freq: Three times a day (TID) | INTRAMUSCULAR | Status: DC
  Filled 2021-08-06: qty 0.09

## 2021-08-06 MED ORDER — SODIUM CHLORIDE 0.9 % IV SOLN: 500.0000 mg | INTRAVENOUS | Status: DC

## 2021-08-06 MED ORDER — APIXABAN 5 MG PO TABS
5.0000 mg | ORAL_TABLET | Freq: Two times a day (BID) | ORAL | 2 refills | Status: DC
  Filled 2021-08-06: qty 60, 30d supply, fill #0
  Filled 2021-09-19: qty 60, 30d supply, fill #1

## 2021-08-06 MED ORDER — MORPHINE SULFATE (PF) 2 MG/ML IV SOLN
0.5000 mg | Freq: Once | INTRAVENOUS | Status: AC
  Administered 2021-08-06: 0.5 mg via INTRAVENOUS
  Filled 2021-08-06: qty 1

## 2021-08-06 MED ORDER — ENOXAPARIN SODIUM 40 MG/0.4ML IJ SOSY
40.0000 mg | PREFILLED_SYRINGE | INTRAMUSCULAR | Status: DC
  Administered 2021-08-06: 40 mg via SUBCUTANEOUS
  Filled 2021-08-06: qty 0.4

## 2021-08-06 MED ORDER — APIXABAN 5 MG PO TABS
10.0000 mg | ORAL_TABLET | Freq: Two times a day (BID) | ORAL | Status: DC
  Administered 2021-08-06: 10 mg via ORAL
  Filled 2021-08-06: qty 2

## 2021-08-06 MED ORDER — APIXABAN (ELIQUIS) VTE STARTER PACK (10MG AND 5MG)
ORAL_TABLET | ORAL | 0 refills | Status: DC
  Filled 2021-08-06: qty 74, 30d supply, fill #0

## 2021-08-06 MED ORDER — IOHEXOL 350 MG/ML SOLN
80.0000 mL | Freq: Once | INTRAVENOUS | Status: AC | PRN
  Administered 2021-08-06: 80 mL via INTRAVENOUS

## 2021-08-06 MED ORDER — GUAIFENESIN 100 MG/5ML PO LIQD
5.0000 mL | ORAL | Status: DC | PRN
  Administered 2021-08-06 (×2): 5 mL via ORAL
  Filled 2021-08-06 (×2): qty 10

## 2021-08-06 MED ORDER — ACETAMINOPHEN 650 MG RE SUPP: 650.0000 mg | Freq: Four times a day (QID) | RECTAL | Status: DC | PRN

## 2021-08-06 MED ORDER — FLUTICASONE PROPIONATE 50 MCG/ACT NA SUSP
2.0000 | Freq: Once | NASAL | Status: AC
  Administered 2021-08-06: 2 via NASAL
  Filled 2021-08-06: qty 16

## 2021-08-06 MED ORDER — CEFTRIAXONE SODIUM 2 G IJ SOLR: 2.0000 g | INTRAMUSCULAR | Status: DC

## 2021-08-06 NOTE — Discharge Summary (Signed)
?Physician Discharge Summary  ? Donna Duarte ZSW:[id number] DOB: [birth year] DOA: 08/05/2021 ? ?PCP: Glendale Chard, MD ? ?Admit date: 08/05/2021 ?Discharge date: 08/06/2021  ? ?Admitted From: Home ?Disposition:  Home ?Discharging physician: Dwyane Dee, MD ? ?Recommendations for Outpatient Follow-up:  ?Started on Eliquis for RLE DVT ? ? ?Discharge Condition: stable ?CODE STATUS: Full ?Diet recommendation:  ?Diet Orders (From admission, onward)  ? ?  Start     Ordered  ? 08/06/21 0122  Diet heart healthy/carb modified Room service appropriate? Yes; Fluid consistency: Thin  Diet effective now       ?Question Answer Comment  ?Diet-HS Snack? Nothing   ?Room service appropriate? Yes   ?Fluid consistency: Thin   ?  ? 08/06/21 0121  ? 08/06/21 0000  Diet - low sodium heart healthy       ? 08/06/21 1431  ? 08/06/21 0000  Diet Carb Modified       ? 08/06/21 1431  ? ?  ?  ? ?  ? ? ?Hospital Course: ? ?Donna Duarte is a 61 yo female with PMH DMII, HTN, HLD who presented with fever and shortness of breath.  She has almost completed a course of Levaquin outpatient for treatment of pneumonia.  Due to her respiratory symptoms as well as complaints of pain in bilateral legs, she was admitted for further work-up. ?CTA chest was obtained to rule out PE which was negative.  No further pneumonia was appreciated on imaging. ?Lower extremity duplex was obtained which showed acute DVT involving right peroneal vein.  She was started on Eliquis. ?Temperature defervesced prior to discharge and cough and breathing were stable.  She was breathing adequately on room air with no oxygen requirement.  She was considered stable for discharging home with outpatient follow-up. ? ? ?The patient's chronic medical conditions were treated accordingly per the patient's home medication regimen except as noted.  On day of discharge, patient was felt deemed stable for discharge. Patient/family member advised to call PCP or come back to ER if needed.   ? ?Principal Diagnosis: SIRS (systemic inflammatory response syndrome) (HCC) ? ?Discharge Diagnoses: ?Principal Problem: ?  SIRS (systemic inflammatory response syndrome) (HCC) ?Active Problems: ?  Type 2 diabetes mellitus with stage 3a chronic kidney disease, without long-term current use of insulin ([place]) ?  Stage 3a chronic kidney disease ([place]) ?  Essential hypertension ?  DVT (deep venous thrombosis) ([place]) ? ? ?Discharge Instructions   ? ? Diet - low sodium heart healthy   Complete by: As directed ?  ? Diet Carb Modified   Complete by: As directed ?  ? Increase activity slowly   Complete by: As directed ?  ? ?  ? ?Allergies as of 08/06/2021   ? ?   Reactions  ? Fish Oil Hives, Itching  ? Penicillins Hives, Swelling  ? Fentanyl Itching  ? ?  ? ?  ?Medication List  ?  ? ?STOP taking these medications   ? ?levofloxacin 750 MG tablet ?Commonly known as: Levaquin ?  ? ?  ? ?TAKE these medications   ? ?albuterol 108 (90 Base) MCG/ACT inhaler ?Commonly known as: VENTOLIN HFA ?Inhale into the lungs every 6 (six) hours as needed for wheezing or shortness of breath. ?  ?atorvastatin 10 MG tablet ?Commonly known as: LIPITOR ?Take 1 tablet (10 mg total) by mouth daily. ?  ?Eliquis DVT/PE Starter Pack ?Generic drug: Apixaban Starter Pack ('10mg'$  and '5mg'$ ) ?Take as directed on package: start with two-'5mg'$  tablets twice daily for  7 days. On day 8, switch to one-'5mg'$  tablet twice daily. ?  ?Eliquis 5 MG Tabs tablet ?Generic drug: apixaban ?Take 1 tablet (5 mg total) by mouth 2 (two) times daily. **Start taking after completing starter pack ?Start taking on: September 06, 2021 ?  ?lisinopril 10 MG tablet ?Commonly known as: ZESTRIL ?Take 1 tablet (10 mg total) by mouth daily. ?  ?Ozempic (0.25 or 0.5 MG/DOSE) 2 MG/1.5ML Sopn ?Generic drug: Semaglutide(0.25 or 0.'5MG'$ /DOS) ?INJECT 0.5 MG UNDER THE SKIN ONCE WEEKLY ?What changed:  ?how much to take ?how to take this ?when to take this ?additional instructions ?  ?Pen Needles 32G X 4 MM  Misc ?Use as directed with Ozempic pen ?  ?VITAMIN D3 PO ?Take 1 tablet by mouth daily. ?  ?ZINC PO ?Take 1 tablet by mouth daily. ?  ? ?  ? ? ?Allergies  ?Allergen Reactions  ? Fish Oil Hives and Itching  ? Penicillins Hives and Swelling  ? Fentanyl Itching  ? ? ?Consultations: ? ? ?Procedures: ? ? ?Discharge Exam: ?BP 131/76   Pulse 89   Temp 99.4 ?F (37.4 ?C) (Oral)   Resp 19   LMP 12/15/2012   SpO2 98%  ?Physical Exam ?Constitutional:   ?   General: She is not in acute distress. ?   Appearance: Normal appearance.  ?HENT:  ?   Head: Normocephalic and atraumatic.  ?   Mouth/Throat:  ?   Mouth: Mucous membranes are moist.  ?Eyes:  ?   Extraocular Movements: Extraocular movements intact.  ?Cardiovascular:  ?   Rate and Rhythm: Normal rate and regular rhythm.  ?Pulmonary:  ?   Effort: Pulmonary effort is normal. No respiratory distress.  ?   Breath sounds: No wheezing.  ?Abdominal:  ?   General: Bowel sounds are normal. There is no distension.  ?   Palpations: Abdomen is soft.  ?Musculoskeletal:  ?   Cervical back: Normal range of motion and neck supple.  ?   Comments: Trace LE edema B/L. Positive Homan sign in B/L LE. No palpable cord   ?Skin: ?   General: Skin is warm and dry.  ?Neurological:  ?   General: No focal deficit present.  ?   Mental Status: She is alert and oriented to person, place, and time.  ?Psychiatric:     ?   Mood and Affect: Mood normal.  ?  ? ?The results of significant diagnostics from this hospitalization (including imaging, microbiology, ancillary and laboratory) are listed below for reference.   ?Microbiology: ?Recent Results (from the past 240 hour(s))  ?Urine Culture     Status: None  ? Collection Time: 08/04/21  5:13 PM  ? Specimen: Urine  ? UR  ?Result Value Ref Range Status  ? Urine Culture, Routine Final report  Final  ? Organism ID, Bacteria No growth  Final  ?Resp Panel by RT-PCR (Flu A&B, Covid) Nasopharyngeal Swab     Status: None  ? Collection Time: 08/05/21 11:13 PM  ?  Specimen: Nasopharyngeal Swab; Nasopharyngeal(NP) swabs in vial transport medium  ?Result Value Ref Range Status  ? SARS Coronavirus 2 by RT PCR NEGATIVE NEGATIVE Final  ?  Comment: (NOTE) ?SARS-CoV-2 target nucleic acids are NOT DETECTED. ? ?The SARS-CoV-2 RNA is generally detectable in upper respiratory ?specimens during the acute phase of infection. The lowest ?concentration of SARS-CoV-2 viral copies this assay can detect is ?138 copies/mL. A negative result does not preclude SARS-Cov-2 ?infection and should not be used as the sole  basis for treatment or ?other patient management decisions. A negative result may occur with  ?improper specimen collection/handling, submission of specimen other ?than nasopharyngeal swab, presence of viral mutation(s) within the ?areas targeted by this assay, and inadequate number of viral ?copies(<138 copies/mL). A negative result must be combined with ?clinical observations, patient history, and epidemiological ?information. The expected result is Negative. ? ?Fact Sheet for Patients:  ?EntrepreneurPulse.com.au ? ?Fact Sheet for Healthcare Providers:  ?IncredibleEmployment.be ? ?This test is no t yet approved or cleared by the [place] FDA and  ?has been authorized for detection and/or diagnosis of SARS-CoV-2 by ?FDA under an Emergency Use Authorization (EUA). This EUA will remain  ?in effect (meaning this test can be used) for the duration of the ?COVID-19 declaration under Section 564(b)(1) of the Act, 21 ?U.S.C.section 360bbb-3(b)(1), unless the authorization is terminated  ?or revoked sooner.  ? ? ?  ? Influenza A by PCR NEGATIVE NEGATIVE Final  ? Influenza B by PCR NEGATIVE NEGATIVE Final  ?  Comment: (NOTE) ?The Xpert Xpress SARS-CoV-2/FLU/RSV plus assay is intended as an aid ?in the diagnosis of influenza from Nasopharyngeal swab specimens and ?should not be used as a sole basis for treatment. Nasal washings and ?aspirates are  unacceptable for Xpert Xpress SARS-CoV-2/FLU/RSV ?testing. ? ?Fact Sheet for Patients: ?EntrepreneurPulse.com.au ? ?Fact Sheet for Healthcare Providers: ?IncredibleEmployment.be ? ?This test is not y

## 2021-08-06 NOTE — ED Notes (Signed)
Pt. Made aware for the need of urine specimen. 

## 2021-08-06 NOTE — ED Notes (Signed)
Tray given. ?

## 2021-08-06 NOTE — ED Notes (Signed)
Dr. Raliegh Ip made aware that patient is considering leaving AMA. ?

## 2021-08-06 NOTE — Discharge Instructions (Signed)
Information on my medicine - ELIQUIS? (apixaban) ? ?Why was Eliquis? prescribed for you? ?Eliquis? was prescribed to treat blood clots that may have been found in the veins of your legs (deep vein thrombosis) or in your lungs (pulmonary embolism) and to reduce the risk of them occurring again. ? ?What do You need to know about Eliquis? ? ?The starting dose is 10 mg (two 5 mg tablets) taken TWICE daily for the FIRST SEVEN (7) DAYS, then on (enter date)  08/13/21  the dose is reduced to ONE 5 mg tablet taken TWICE daily.  Eliquis? may be taken with or without food.  ? ?Try to take the dose about the same time in the morning and in the evening. If you have difficulty swallowing the tablet whole please discuss with your pharmacist how to take the medication safely. ? ?Take Eliquis? exactly as prescribed and DO NOT stop taking Eliquis? without talking to the doctor who prescribed the medication.  Stopping may increase your risk of developing a new blood clot.  Refill your prescription before you run out. ? ?After discharge, you should have regular check-up appointments with your healthcare provider that is prescribing your Eliquis?. ?   ?What do you do if you miss a dose? ?If a dose of ELIQUIS? is not taken at the scheduled time, take it as soon as possible on the same day and twice-daily administration should be resumed. The dose should not be doubled to make up for a missed dose. ? ?Important Safety Information ?A possible side effect of Eliquis? is bleeding. You should call your healthcare provider right away if you experience any of the following: ?Bleeding from an injury or your nose that does not stop. ?Unusual colored urine (red or dark brown) or unusual colored stools (red or black). ?Unusual bruising for unknown reasons. ?A serious fall or if you hit your head (even if there is no bleeding). ? ?Some medicines may interact with Eliquis? and might increase your risk of bleeding or clotting while on Eliquis?Marland Kitchen To  help avoid this, consult your healthcare provider or pharmacist prior to using any new prescription or non-prescription medications, including herbals, vitamins, non-steroidal anti-inflammatory drugs (NSAIDs) and supplements. ? ?This website has more information on Eliquis? (apixaban): http://www.eliquis.com/eliquis/home ? ?

## 2021-08-06 NOTE — ED Provider Notes (Signed)
?[facility] DEPT ?Provider Note ? ? ?CSN: [id number] ?Arrival date & time: 08/05/21  2259 ? ?  ? ?History ? ?Chief Complaint  ?Patient presents with  ? Pneumonia  ? Generalized Body Aches  ? Fever  ? Shortness of Breath  ? ? ?Donna Duarte is a 61 y.o. female. ? ?61 yo F with a chief complaint of cough shortness of breath and fever.  This has been going on for about a week now.  The patient was seen at urgent care and was diagnosed with right-sided pneumonia.  She was started on Levaquin due to a penicillin allergy.  The patient since then has clinically worsened.  She started having high fevers myalgias and just generally feeling unwell.  Not eating and drinking as well as she normally does.  She called her family doctor who suggested he she come here for evaluation. ? ? ?Pneumonia ?Associated symptoms include shortness of breath.  ?Fever ?Shortness of Breath ?Associated symptoms: fever   ? ?  ? ?Home Medications ?Prior to Admission medications   ?Medication Sig Start Date End Date Taking? Authorizing Provider  ?albuterol (VENTOLIN HFA) 108 (90 Base) MCG/ACT inhaler Inhale into the lungs every 6 (six) hours as needed for wheezing or shortness of breath.   Yes [provider]  ?atorvastatin (LIPITOR) 10 MG tablet Take 1 tablet (10 mg total) by mouth daily. 06/28/20  Yes Glendale Chard, MD  ?Cholecalciferol (VITAMIN D3 PO) Take 1 tablet by mouth daily.   Yes [provider]  ?levofloxacin (LEVAQUIN) 750 MG tablet Take 1 tablet (750 mg total) by mouth daily for 7 days. 07/31/21 08/07/21 Yes Raspet, Erin K, PA-C  ?lisinopril (ZESTRIL) 10 MG tablet Take 1 tablet (10 mg total) by mouth daily. 06/28/20  Yes Glendale Chard, MD  ?Multiple Vitamins-Minerals (ZINC PO) Take 1 tablet by mouth daily.   Yes [provider]  ?Semaglutide,0.25 or 0.'5MG'$ /DOS, (OZEMPIC, 0.25 OR 0.5 MG/DOSE,) 2 MG/1.5ML SOPN INJECT 0.5 MG UNDER THE SKIN ONCE WEEKLY ?Patient taking differently: Inject 0.5  mg into the skin once a week. Tuesday 01/18/21  Yes Glendale Chard, MD  ?Insulin Pen Needle (PEN NEEDLES) 32G X 4 MM MISC Use as directed with Ozempic pen 07/14/20   Glendale Chard, MD  ?   ? ?Allergies    ?Fish oil, Penicillins, and Fentanyl   ? ?Review of Systems   ?Review of Systems  ?Constitutional:  Positive for fever.  ?Respiratory:  Positive for shortness of breath.   ? ?Physical Exam ?Updated Vital Signs ?BP (!) 160/88 (BP Location: Left Arm)   Pulse (!) 110   Temp 100.2 ?F (37.9 ?C) (Oral)   Resp (!) 23   LMP 12/15/2012   SpO2 96%  ?Physical Exam ?Vitals and nursing note reviewed.  ?Constitutional:   ?   General: She is not in acute distress. ?   Appearance: She is well-developed. She is not diaphoretic.  ?HENT:  ?   Head: Normocephalic and atraumatic.  ?Eyes:  ?   Pupils: Pupils are equal, round, and reactive to light.  ?Cardiovascular:  ?   Rate and Rhythm: Normal rate and regular rhythm.  ?   Heart sounds: No murmur heard. ?  No friction rub. No gallop.  ?Pulmonary:  ?   Effort: Pulmonary effort is normal.  ?   Breath sounds: Rhonchi (RLL) present. No wheezing or rales.  ?Abdominal:  ?   General: There is no distension.  ?   Palpations: Abdomen is soft.  ?  Tenderness: There is no abdominal tenderness.  ?Musculoskeletal:     ?   General: No tenderness.  ?   Cervical back: Normal range of motion and neck supple.  ?Skin: ?   General: Skin is warm and dry.  ?Neurological:  ?   Mental Status: She is alert and oriented to person, place, and time.  ?Psychiatric:     ?   Behavior: Behavior normal.  ? ? ?ED Results / Procedures / Treatments   ?Labs ?(all labs ordered are listed, but only abnormal results are displayed) ?Labs Reviewed  ?CBC WITH DIFFERENTIAL/PLATELET - Abnormal; Notable for the following components:  ?    Result Value  ? Lymphs Abs 0.3 (*)   ? All other components within normal limits  ?COMPREHENSIVE METABOLIC PANEL - Abnormal; Notable for the following components:  ? Glucose, Bld 138 (*)    ? Creatinine, Ser 1.06 (*)   ? Calcium 8.8 (*)   ? GFR, Estimated 59 (*)   ? All other components within normal limits  ?RESP PANEL BY RT-PCR (FLU A&B, COVID) [id number]  ?CULTURE, BLOOD (ROUTINE X 2)  ?CULTURE, BLOOD (ROUTINE X 2)  ?LACTIC ACID, PLASMA  ?PROTIME-INR  ?LACTIC ACID, PLASMA  ?URINALYSIS, ROUTINE W REFLEX MICROSCOPIC  ? ? ?EKG ?EKG Interpretation ? ?Date/Time:  Friday Aug 05 2021 23:23:12 EDT ?Ventricular Rate:  116 ?PR Interval:  147 ?QRS Duration: 79 ?QT Interval:  317 ?QTC Calculation: 441 ?R Axis:   -36 ?Text Interpretation: Sinus tachycardia Abnormal R-wave progression, early transition Left ventricular hypertrophy No significant change since last tracing Confirmed by Deno Etienne [phone]) on 08/05/2021 11:37:11 PM ? ?Radiology ?DG Chest 2 View ? ?Result Date: 08/06/2021 ?CLINICAL DATA:  Fever EXAM: CHEST - 2 VIEW COMPARISON:  07/31/2021 FINDINGS: Previously seen opacity at the right lung base has resolved. No confluent airspace opacities or effusions. Heart is normal size. Aortic calcifications. No acute bony abnormality. IMPRESSION: Resolution of previously seen right basilar opacity. No active disease. Electronically Signed   By: Rolm Baptise M.D.   On: 08/06/2021 00:14   ? ?Procedures ?Procedures  ? ? ?Medications Ordered in ED ?Medications  ?cefTRIAXone (ROCEPHIN) 2 g in sodium chloride 0.9 % 100 mL IVPB (2 g Intravenous New Bag/Given 08/06/21 0132)  ?azithromycin (ZITHROMAX) 500 mg in sodium chloride 0.9 % 250 mL IVPB (has no administration in time range)  ? ? ?ED Course/ Medical Decision Making/ A&P ?  ?                        ?Medical Decision Making ?Amount and/or Complexity of Data Reviewed ?Labs: ordered. ? ?Risk ?Decision regarding hospitalization. ? ? ?61 yo F with a chief complaint of cough congestion fevers chills and myalgias.  She has been sick now for about a week.  Was seen by urgent care and had an x-ray that showed a right lower lobe infiltrate it is independently interpreted by me.   She was then started on Levaquin.  Despite being on Levaquin she had worsening symptoms fevers worsening difficulty breathing myalgias.  Called her family doctor who suggested she come here. ? ?Work-up here is reassuring, no leukocytosis no significant electrolyte abnormality chest x-ray independently interpreted by me without focal infiltrate.  It is hard to explain how the patient's infiltrate has resolved but she is feeling much worse clinically.  Had a temp of 102 on arrival here.  Will start on IV antibiotics.  Will discuss with medicine. ? ?The patients results  and plan were reviewed and discussed.   ?Any x-rays performed were independently reviewed by myself.  ? ?Differential diagnosis were considered with the presenting HPI. ? ?Medications  ?cefTRIAXone (ROCEPHIN) 2 g in sodium chloride 0.9 % 100 mL IVPB (2 g Intravenous New Bag/Given 08/06/21 0132)  ?azithromycin (ZITHROMAX) 500 mg in sodium chloride 0.9 % 250 mL IVPB (has no administration in time range)  ? ? ?Vitals:  ? 08/05/21 2313 08/05/21 2357  ?BP: (!) 168/96 (!) 160/88  ?Pulse: (!) 121 (!) 110  ?Resp: (!) 21 (!) 23  ?Temp: (!) 102.3 ?F (39.1 ?C) 100.2 ?F (37.9 ?C)  ?TempSrc: Oral Oral  ?SpO2: 94% 96%  ? ? ?Final diagnoses:  ?Community acquired pneumonia of right lower lobe of lung  ? ? ?Admission/ observation were discussed with the admitting physician, patient and/or family and they are comfortable with the plan.  ? ? ? ? ? ? ? ? ?Final Clinical Impression(s) / ED Diagnoses ?Final diagnoses:  ?Community acquired pneumonia of right lower lobe of lung  ? ? ?Rx / DC Orders ?ED Discharge Orders   ? ? None  ? ?  ? ? ?  ?Deno Etienne, DO ?08/06/21 0152 ? ?

## 2021-08-06 NOTE — H&P (Signed)
?History and Physical  ? ? Donna Duarte BMW:[id number] DOB: 10/26/1960 DOA: 08/05/2021 ? ?PCP: Glendale Chard, MD  ?Patient coming from: Home. ? ?Chief Complaint: Fever shortness of breath. ? ?HPI: Donna Duarte is a 61 y.o. female with history of diabetes mellitus type 2, hyperlipidemia, hypertension who was diagnosed with pneumonia about a week ago was on Levaquin last dose of which was today presents with worsening symptoms.  Patient states initially she had productive cough and shortness of breath and at that time chest x-ray showed features concerning for pneumonia and was started on Levaquin.  Initially couple of days patient's symptoms improved but last 2 to 3 days she has been getting increasing body aches particularly extremities and lower extremities around the calf.  Also complains of some joint pains on the wrist.  Productive cough.  Denies any nausea vomiting dysuria or discharges or diarrhea. ? ?ED Course: In the ER chest x-ray was unremarkable patient was febrile with temperature 102 ?F.  UA is concerning for UTI.  Her blood cultures drawn and started on empiric antibiotics. ? ?Review of Systems: As per HPI, rest all negative. ? ? ?Past Medical History:  ?Diagnosis Date  ? Allergy   ? Diabetes mellitus without complication ([place])   ? Hyperlipidemia   ? Hypertension   ? Thyroid disease   ? ? ?Past Surgical History:  ?Procedure Laterality Date  ? [place]  ? CHOLECYSTECTOMY  2002  ? ENDOMETRIAL ABLATION  2012  ? TUBAL LIGATION  1998  ? ? ? reports that she has never smoked. She has never used smokeless tobacco. She reports current alcohol use. She reports that she does not use drugs. ? ?Allergies  ?Allergen Reactions  ? Fish Oil Hives and Itching  ? Penicillins Hives and Swelling  ? Fentanyl Itching  ? ? ?Family History  ?Problem Relation Age of Onset  ? Hypertension Mother   ? Diabetes Mother   ? ? ?Prior to Admission medications   ?Medication Sig Start Date End Date Taking? Authorizing  Provider  ?albuterol (VENTOLIN HFA) 108 (90 Base) MCG/ACT inhaler Inhale into the lungs every 6 (six) hours as needed for wheezing or shortness of breath.   Yes [provider]  ?atorvastatin (LIPITOR) 10 MG tablet Take 1 tablet (10 mg total) by mouth daily. 06/28/20  Yes Glendale Chard, MD  ?Cholecalciferol (VITAMIN D3 PO) Take 1 tablet by mouth daily.   Yes [provider]  ?levofloxacin (LEVAQUIN) 750 MG tablet Take 1 tablet (750 mg total) by mouth daily for 7 days. 07/31/21 08/07/21 Yes Raspet, Erin K, PA-C  ?lisinopril (ZESTRIL) 10 MG tablet Take 1 tablet (10 mg total) by mouth daily. 06/28/20  Yes Glendale Chard, MD  ?Multiple Vitamins-Minerals (ZINC PO) Take 1 tablet by mouth daily.   Yes [provider]  ?Semaglutide,0.25 or 0.'5MG'$ /DOS, (OZEMPIC, 0.25 OR 0.5 MG/DOSE,) 2 MG/1.5ML SOPN INJECT 0.5 MG UNDER THE SKIN ONCE WEEKLY ?Patient taking differently: Inject 0.5 mg into the skin once a week. Tuesday 01/18/21  Yes Glendale Chard, MD  ?Insulin Pen Needle (PEN NEEDLES) 32G X 4 MM MISC Use as directed with Ozempic pen 07/14/20   Glendale Chard, MD  ? ? ?Physical Exam: ?Constitutional: Moderately built and nourished. ?Vitals:  ? 08/06/21 0300 08/06/21 0315 08/06/21 0330 08/06/21 0345  ?BP: (!) 158/102 140/79 (!) 151/106 (!) 155/98  ?Pulse: (!) 109 (!) 110 (!) 105 65  ?Resp:  '16 16 18  '$ ?Temp:      ?TempSrc:      ?  SpO2: 92% 94% 94% 97%  ? ?Eyes: Anicteric no pallor. ?ENMT: No discharge from the ears eyes nose and mouth. ?Neck: No mass felt.  No neck rigidity. ?Respiratory: No rhonchi or crepitations. ?Cardiovascular: S1-S2 heard. ?Abdomen: Soft nontender bowel sound present. ?Musculoskeletal: No edema. ?Skin: No rash. ?Neurologic: Alert awake oriented to time place and person.  Moves all extremities. ?Psychiatric: Appears normal.  Normal affect. ? ? ?Labs on Admission: I have personally reviewed following labs and imaging studies ? ?CBC: ?Recent Labs  ?Lab 08/05/21 ?2340  ?WBC 4.8  ?NEUTROABS 3.7   ?HGB 14.2  ?HCT 42.0  ?MCV 85.5  ?PLT 153  ? ?Basic Metabolic Panel: ?Recent Labs  ?Lab 08/03/21 ?1724 08/05/21 ?2340  ?NA 142 136  ?K 4.0 3.5  ?CL 105 106  ?CO2 24 24  ?GLUCOSE 108* 138*  ?BUN 11 11  ?CREATININE 0.94 1.06*  ?CALCIUM 9.4 8.8*  ? ?GFR: ?CrCl cannot be calculated (Unknown ideal weight.). ?Liver Function Tests: ?Recent Labs  ?Lab 08/03/21 ?1724 08/05/21 ?2340  ?AST 16 16  ?ALT 18 17  ?ALKPHOS 106 87  ?BILITOT 0.3 0.5  ?PROT 7.3 7.9  ?ALBUMIN 4.4 4.0  ? ?No results for input(s): LIPASE, AMYLASE in the last 168 hours. ?No results for input(s): AMMONIA in the last 168 hours. ?Coagulation Profile: ?Recent Labs  ?Lab 08/05/21 ?2340  ?INR 1.0  ? ?Cardiac Enzymes: ?No results for input(s): CKTOTAL, CKMB, CKMBINDEX, TROPONINI in the last 168 hours. ?BNP (last 3 results) ?No results for input(s): PROBNP in the last 8760 hours. ?HbA1C: ?Recent Labs  ?  08/03/21 ?1724  ?HGBA1C 6.0*  ? ?CBG: ?No results for input(s): GLUCAP in the last 168 hours. ?Lipid Profile: ?No results for input(s): CHOL, HDL, LDLCALC, TRIG, CHOLHDL, LDLDIRECT in the last 72 hours. ?Thyroid Function Tests: ?No results for input(s): TSH, T4TOTAL, FREET4, T3FREE, THYROIDAB in the last 72 hours. ?Anemia Panel: ?No results for input(s): VITAMINB12, FOLATE, FERRITIN, TIBC, IRON, RETICCTPCT in the last 72 hours. ?Urine analysis: ?   ?Component Value Date/Time  ? COLORURINE YELLOW 08/06/2021 0200  ? APPEARANCEUR HAZY (A) 08/06/2021 0200  ? LABSPEC 1.018 08/06/2021 0200  ? PHURINE 5.0 08/06/2021 0200  ? GLUCOSEU NEGATIVE 08/06/2021 0200  ? HGBUR MODERATE (A) 08/06/2021 0200  ? [place] NEGATIVE 08/06/2021 0200  ? BILIRUBINUR negative 09/15/2020 1620  ? [place] NEGATIVE 08/06/2021 0200  ? PROTEINUR NEGATIVE 08/06/2021 0200  ? UROBILINOGEN 0.2 09/15/2020 1620  ? UROBILINOGEN 0.2 02/27/2012 2219  ? NITRITE NEGATIVE 08/06/2021 0200  ? LEUKOCYTESUR LARGE (A) 08/06/2021 0200  ? ?Sepsis Labs: ?'@LABRCNTIP'$ (procalcitonin:4,lacticidven:4) ?) ?Recent  Results (from the past 240 hour(s))  ?Resp Panel by RT-PCR (Flu A&B, Covid) Nasopharyngeal Swab     Status: None  ? Collection Time: 08/05/21 11:13 PM  ? Specimen: Nasopharyngeal Swab; Nasopharyngeal(NP) swabs in vial transport medium  ?Result Value Ref Range Status  ? SARS Coronavirus 2 by RT PCR NEGATIVE NEGATIVE Final  ?  Comment: (NOTE) ?SARS-CoV-2 target nucleic acids are NOT DETECTED. ? ?The SARS-CoV-2 RNA is generally detectable in upper respiratory ?specimens during the acute phase of infection. The lowest ?concentration of SARS-CoV-2 viral copies this assay can detect is ?138 copies/mL. A negative result does not preclude SARS-Cov-2 ?infection and should not be used as the sole basis for treatment or ?other patient management decisions. A negative result may occur with  ?improper specimen collection/handling, submission of specimen other ?than nasopharyngeal swab, presence of viral mutation(s) within the ?areas targeted by this assay, and inadequate number of viral ?copies(<138  copies/mL). A negative result must be combined with ?clinical observations, patient history, and epidemiological ?information. The expected result is Negative. ? ?Fact Sheet for Patients:  ?EntrepreneurPulse.com.au ? ?Fact Sheet for Healthcare Providers:  ?IncredibleEmployment.be ? ?This test is no t yet approved or cleared by the [place] FDA and  ?has been authorized for detection and/or diagnosis of SARS-CoV-2 by ?FDA under an Emergency Use Authorization (EUA). This EUA will remain  ?in effect (meaning this test can be used) for the duration of the ?COVID-19 declaration under Section 564(b)(1) of the Act, 21 ?U.S.C.section 360bbb-3(b)(1), unless the authorization is terminated  ?or revoked sooner.  ? ? ?  ? Influenza A by PCR NEGATIVE NEGATIVE Final  ? Influenza B by PCR NEGATIVE NEGATIVE Final  ?  Comment: (NOTE) ?The Xpert Xpress SARS-CoV-2/FLU/RSV plus assay is intended as an aid ?in the  diagnosis of influenza from Nasopharyngeal swab specimens and ?should not be used as a sole basis for treatment. Nasal washings and ?aspirates are unacceptable for Xpert Xpress SARS-CoV-2/FLU/RSV ?testing. ?

## 2021-08-06 NOTE — Progress Notes (Signed)
Bilateral lower extremity venous duplex completed. ?Refer to "CV Proc" under chart review to view preliminary results. ? ?Preliminary results discussed with Dr. Sabino Gasser and RN Ladona Mow. ? ?08/06/2021 2:07 PM ?Kelby Aline., MHA, RVT, RDCS, RDMS   ?

## 2021-08-08 ENCOUNTER — Encounter: Payer: Self-pay | Admitting: Internal Medicine

## 2021-08-08 ENCOUNTER — Telehealth: Payer: Self-pay

## 2021-08-08 LAB — ANA W/REFLEX IF POSITIVE: Anti Nuclear Antibody (ANA): NEGATIVE

## 2021-08-08 NOTE — Telephone Encounter (Signed)
Transition Care Management Follow-up Telephone Call ?Date of discharge and from where: 08/06/2021 [place]  ?How have you been since you were released from the hospital? Pt states she is still coughing, she does not have a temp.  ?Any questions or concerns? No ? ?Items Reviewed: ?Did the pt receive and understand the discharge instructions provided? Yes  ?Medications obtained and verified? Yes  ?Other? Yes  ?Any new allergies since your discharge? No  ?Dietary orders reviewed? Yes ?Do you have support at home? Yes  ? ?Home Care and Equipment/Supplies: ?Were home health services ordered? no ?If so, what is the name of the agency? N/a  ?Has the agency set up a time to come to the patient's home? no ?Were any new equipment or medical supplies ordered?  No ?What is the name of the medical supply agency? N/a ?Were you able to get the supplies/equipment? no ?Do you have any questions related to the use of the equipment or supplies? No ? ?Functional Questionnaire: (I = Independent and D = Dependent) ?ADLs: i ? ?Bathing/Dressing- i ? ?Meal Prep- i ? ?Eating- i ? ?Maintaining continence- i ? ?Transferring/Ambulation- i ? ?Managing Meds- i ? ?Follow up appointments reviewed: ? ?PCP Hospital f/u appt confirmed? Yes  Scheduled to see robyn sanders on 08/10/2021 @ [facility]. ?[facility] f/u appt confirmed? No  Scheduled to see n/a on n/a @ n/a. ?Are transportation arrangements needed? No  ?If their condition worsens, is the pt aware to call PCP or go to the Emergency Dept.? Yes ?Was the patient provided with contact information for the PCP's office or ED? Yes ?Was to pt encouraged to call back with questions or concerns? Yes  ?

## 2021-08-10 ENCOUNTER — Encounter: Payer: Self-pay | Admitting: Internal Medicine

## 2021-08-10 ENCOUNTER — Ambulatory Visit: Payer: BC Managed Care – PPO | Admitting: Internal Medicine

## 2021-08-10 VITALS — BP 140/90 | HR 86 | Temp 97.8°F | Ht 66.0 in | Wt 211.8 lb

## 2021-08-10 DIAGNOSIS — I82451 Acute embolism and thrombosis of right peroneal vein: Secondary | ICD-10-CM

## 2021-08-10 DIAGNOSIS — N1831 Chronic kidney disease, stage 3a: Secondary | ICD-10-CM | POA: Diagnosis not present

## 2021-08-10 DIAGNOSIS — I129 Hypertensive chronic kidney disease with stage 1 through stage 4 chronic kidney disease, or unspecified chronic kidney disease: Secondary | ICD-10-CM

## 2021-08-10 DIAGNOSIS — J189 Pneumonia, unspecified organism: Secondary | ICD-10-CM | POA: Diagnosis not present

## 2021-08-10 DIAGNOSIS — Z6834 Body mass index (BMI) 34.0-34.9, adult: Secondary | ICD-10-CM

## 2021-08-10 DIAGNOSIS — D6869 Other thrombophilia: Secondary | ICD-10-CM

## 2021-08-10 DIAGNOSIS — E6609 Other obesity due to excess calories: Secondary | ICD-10-CM

## 2021-08-10 NOTE — Progress Notes (Signed)
[name],acting as a Education administrator for [name], MD.,have documented all relevant documentation on the behalf of [name], MD,as directed by  [name], MD while in the presence of [name], MD.  This visit occurred during the SARS-CoV-2 public health emergency.  Safety protocols were in place, including screening questions prior to the visit, additional usage of staff PPE, and extensive cleaning of exam room while observing appropriate contact time as indicated for disinfecting solutions.  Subjective:     Patient ID: Donna Duarte , female    DOB: January 20, 1961 , 61 y.o.   MRN: [id number]   Chief Complaint  Patient presents with   ER F/U    HPI  Patient presents today for a ER f/u.  She presented to ER on 5/13 c/o fever and shortness of breath.  She had almost completed a course of Levaquin outpatient for treatment of pneumonia prior to her presentation.  Due to her respiratory symptoms as well as complaints of pain in bilateral legs, she was admitted for further work-up.  CTA chest was obtained to rule out PE which was negative.  No further pneumonia was appreciated on imaging.  Lower extremity duplex was obtained which showed acute DVT involving right peroneal vein.  She was started on Eliquis. Hospital course significant for temperature defervescence with decreased cough and improved breathing.  She was breathing adequately on room air with no oxygen requirement.  She was considered stable for discharging home with outpatient follow-up.  She is feeling much better since discharge.  She wants to have note to return to work later this week     Past Medical History:  Diagnosis Date   Allergy    Diabetes mellitus without complication ([place])    Hyperlipidemia    Hypertension    Thyroid disease      Family History  Problem Relation Age of Onset   Hypertension Mother    Diabetes Mother      Current Outpatient Medications:    albuterol (VENTOLIN HFA) 108  (90 Base) MCG/ACT inhaler, Inhale into the lungs every 6 (six) hours as needed for wheezing or shortness of breath., Disp: , Rfl:    APIXABAN (ELIQUIS) VTE STARTER PACK ('10MG'$  AND '5MG'$ ), Take as directed on package: start with two-'5mg'$  tablets twice daily for 7 days. On day 8, switch to one-'5mg'$  tablet twice daily., Disp: 74 each, Rfl: 0   atorvastatin (LIPITOR) 10 MG tablet, Take 1 tablet (10 mg total) by mouth daily., Disp: 30 tablet, Rfl: 1   Cholecalciferol (VITAMIN D3 PO), Take 1 tablet by mouth daily., Disp: , Rfl:    Insulin Pen Needle (PEN NEEDLES) 32G X 4 MM MISC, Use as directed with Ozempic pen, Disp: 100 each, Rfl: 1   lisinopril (ZESTRIL) 10 MG tablet, Take 1 tablet (10 mg total) by mouth daily., Disp: 30 tablet, Rfl: 1   Multiple Vitamins-Minerals (ZINC PO), Take 1 tablet by mouth daily., Disp: , Rfl:    Semaglutide,0.25 or 0.'5MG'$ /DOS, (OZEMPIC, 0.25 OR 0.5 MG/DOSE,) 2 MG/1.5ML SOPN, INJECT 0.5 MG UNDER THE SKIN ONCE WEEKLY (Patient taking differently: Inject 0.5 mg into the skin once a week. Tuesday), Disp: 4.5 mL, Rfl: 3   [START ON 09/06/2021] apixaban (ELIQUIS) 5 MG TABS tablet, Take 1 tablet (5 mg total) by mouth 2 (two) times daily. Start taking after completing starter pack (Patient not taking: Reported on 08/10/2021), Disp: 60 tablet, Rfl: 2   Allergies  Allergen Reactions   Fish Oil Hives  and Itching   Penicillins Hives and Swelling   Fentanyl Itching     Review of Systems  Constitutional: Negative.   Respiratory:  Positive for cough.   Cardiovascular: Negative.   Gastrointestinal: Negative.   Neurological: Negative.   Psychiatric/Behavioral: Negative.      Today's Vitals   08/10/21 0952  BP: 140/90  Pulse: 86  Temp: 97.8 F (36.6 C)  Weight: 211 lb 12.8 oz (96.1 kg)  Height: '5\' 6"'$  (1.676 m)  PainSc: 0-No pain   Body mass index is 34.19 kg/m.  Wt Readings from Last 3 Encounters:  08/10/21 211 lb 12.8 oz (96.1 kg)  08/03/21 215 lb (97.5 kg)  01/06/21 215 lb 12.8  oz (97.9 kg)    Objective:  Physical Exam Vitals and nursing note reviewed.  Constitutional:      Appearance: Normal appearance.  HENT:     Head: Normocephalic and atraumatic.  Eyes:     Extraocular Movements: Extraocular movements intact.  Cardiovascular:     Rate and Rhythm: Normal rate and regular rhythm.     Heart sounds: Normal heart sounds.  Pulmonary:     Effort: Pulmonary effort is normal.     Breath sounds: Normal breath sounds.  Musculoskeletal:     Cervical back: Normal range of motion.  Skin:    General: Skin is warm.  Neurological:     General: No focal deficit present.     Mental Status: She is alert.  Psychiatric:        Mood and Affect: Mood normal.        Behavior: Behavior normal.     Assessment And Plan:     1. Community acquired pneumonia of right lower lobe of lung Comments: Resolving. TCM PERFORMED. A MEMBER OF THE CLINICAL TEAM SPOKE WITH THE PATIENT UPON DISCHARGE. DISCHARGE SUMMARY WAS REVIEWED IN FULL DETAIL DURING THE VISIT. MEDS RECONCILED AND COMPARED TO DISCHARGE MEDS. MEDICATION LIST WAS UPDATED AND REVIEWED WITH THE PATIENT. GREATER THAN 50% FACE TO FACE TIME WAS SPENT IN COUNSELING AND COORDINATION OF CARE. ALL QUESTIONS WERE ANSWERED TO THE SATISFACTION OF THE PATIENT.    2. Acute deep vein thrombosis (DVT) of right peroneal vein (HCC) Comments: She is now on Eliquis, encouraged to take meds as directed.   3. Parenchymal renal hypertension, stage 1 through stage 4 or unspecified chronic kidney disease Comments: Chronic, fair control. She is encouraged to follow low sodium diet. I will not change meds today.   4. Stage 3a chronic kidney disease (HCC) Comments: Chronic, she is encouraged to avoid NSAIDs, stay hydrated and keep BP/BS controlled to decrease risk of CKD progression.   5. Class 1 obesity due to excess calories with serious comorbidity and body mass index (BMI) of 34.0 to 34.9 in adult Comments: She is encouraged to aim for at  least 150 minutes of exercise per week once she has recovered from PNA.   6. Acquired thrombophilia ([place]) Comments: She is now on Eliquis due to acute DVT.    Patient was given opportunity to ask questions. Patient verbalized understanding of the plan and was able to repeat key elements of the plan. All questions were answered to their satisfaction.   I, [name], MD, have reviewed all documentation for this visit. The documentation on 08/21/21 for the exam, diagnosis, procedures, and orders are all accurate and complete.   IF YOU HAVE BEEN REFERRED TO A SPECIALIST, IT MAY TAKE 1-2 WEEKS TO SCHEDULE/PROCESS THE REFERRAL. IF YOU HAVE NOT  HEARD FROM US/SPECIALIST IN TWO WEEKS, PLEASE GIVE [place] A CALL AT [phone].   THE PATIENT IS ENCOURAGED TO PRACTICE SOCIAL DISTANCING DUE TO THE COVID-19 PANDEMIC.

## 2021-08-11 LAB — CULTURE, BLOOD (ROUTINE X 2)
Culture: NO GROWTH
Culture: NO GROWTH
Special Requests: ADEQUATE
Special Requests: ADEQUATE

## 2021-08-19 ENCOUNTER — Other Ambulatory Visit (HOSPITAL_COMMUNITY): Payer: Self-pay

## 2021-08-21 ENCOUNTER — Encounter: Payer: Self-pay | Admitting: Internal Medicine

## 2021-08-25 ENCOUNTER — Telehealth (HOSPITAL_BASED_OUTPATIENT_CLINIC_OR_DEPARTMENT_OTHER): Payer: Self-pay

## 2021-08-25 ENCOUNTER — Other Ambulatory Visit (HOSPITAL_COMMUNITY): Payer: Self-pay

## 2021-08-25 NOTE — Telephone Encounter (Signed)
Transitions of Care Pharmacy   Call attempted for a pharmacy transitions of care follow-up. HIPAA appropriate voicemail was left with call back information provided.   Call attempt #1. Will follow-up in 2-3 days.   Darcus Austin, [facility] 08/25/2021 1:36 PM

## 2021-08-29 ENCOUNTER — Other Ambulatory Visit: Payer: Self-pay | Admitting: Internal Medicine

## 2021-08-30 NOTE — Addendum Note (Signed)
Addended by: [name] on: 08/30/2021 09:31 PM   Modules accepted: Level of Service

## 2021-08-31 ENCOUNTER — Other Ambulatory Visit (HOSPITAL_BASED_OUTPATIENT_CLINIC_OR_DEPARTMENT_OTHER): Payer: Self-pay

## 2021-08-31 ENCOUNTER — Telehealth (HOSPITAL_BASED_OUTPATIENT_CLINIC_OR_DEPARTMENT_OTHER): Payer: Self-pay

## 2021-08-31 NOTE — Telephone Encounter (Signed)
Transitions of Care Pharmacy   Call attempted for a pharmacy transitions of care follow-up. HIPAA appropriate voicemail was left with call back information provided.   Call attempt #3. Final attempt  Darcus Austin, PharmD [facility] 08/31/2021 10:34 AM

## 2021-09-16 ENCOUNTER — Other Ambulatory Visit: Payer: Self-pay

## 2021-09-19 ENCOUNTER — Other Ambulatory Visit (HOSPITAL_COMMUNITY): Payer: Self-pay

## 2021-09-19 ENCOUNTER — Other Ambulatory Visit: Payer: Self-pay

## 2021-09-19 MED ORDER — ONETOUCH DELICA PLUS LANCET30G MISC: 2 refills | Status: DC

## 2021-09-19 MED ORDER — ONETOUCH VERIO VI STRP: ORAL_STRIP | 12 refills | Status: DC

## 2021-09-20 ENCOUNTER — Other Ambulatory Visit (HOSPITAL_COMMUNITY): Payer: Self-pay

## 2021-11-03 ENCOUNTER — Other Ambulatory Visit: Payer: BC Managed Care – PPO

## 2021-11-09 ENCOUNTER — Ambulatory Visit: Payer: BC Managed Care – PPO | Admitting: Internal Medicine

## 2021-11-10 ENCOUNTER — Telehealth: Payer: BC Managed Care – PPO | Admitting: Internal Medicine

## 2021-11-17 ENCOUNTER — Other Ambulatory Visit: Payer: Self-pay | Admitting: Internal Medicine

## 2021-12-20 ENCOUNTER — Encounter: Payer: Self-pay | Admitting: Internal Medicine

## 2021-12-20 ENCOUNTER — Ambulatory Visit (INDEPENDENT_AMBULATORY_CARE_PROVIDER_SITE_OTHER): Payer: Commercial Managed Care - HMO | Admitting: Internal Medicine

## 2021-12-20 VITALS — BP 150/90 | HR 98 | Temp 98.3°F | Ht 66.0 in | Wt 213.8 lb

## 2021-12-20 DIAGNOSIS — G44219 Episodic tension-type headache, not intractable: Secondary | ICD-10-CM | POA: Diagnosis not present

## 2021-12-20 DIAGNOSIS — I129 Hypertensive chronic kidney disease with stage 1 through stage 4 chronic kidney disease, or unspecified chronic kidney disease: Secondary | ICD-10-CM

## 2021-12-20 DIAGNOSIS — Z2821 Immunization not carried out because of patient refusal: Secondary | ICD-10-CM

## 2021-12-20 DIAGNOSIS — E1122 Type 2 diabetes mellitus with diabetic chronic kidney disease: Secondary | ICD-10-CM

## 2021-12-20 DIAGNOSIS — E6609 Other obesity due to excess calories: Secondary | ICD-10-CM

## 2021-12-20 DIAGNOSIS — N1831 Chronic kidney disease, stage 3a: Secondary | ICD-10-CM | POA: Diagnosis not present

## 2021-12-20 DIAGNOSIS — Z86718 Personal history of other venous thrombosis and embolism: Secondary | ICD-10-CM

## 2021-12-20 DIAGNOSIS — Z6834 Body mass index (BMI) 34.0-34.9, adult: Secondary | ICD-10-CM

## 2021-12-20 LAB — POCT URINALYSIS DIPSTICK
Bilirubin, UA: NEGATIVE
Glucose, UA: NEGATIVE
Ketones, UA: NEGATIVE
Nitrite, UA: NEGATIVE
Protein, UA: POSITIVE — AB
Spec Grav, UA: 1.025 (ref 1.010–1.025)
Urobilinogen, UA: 0.2 E.U./dL
pH, UA: 6.5 (ref 5.0–8.0)

## 2021-12-20 NOTE — Patient Instructions (Signed)

## 2021-12-20 NOTE — Progress Notes (Signed)
Donna Duarte,acting as a Education administrator for Donna Greenland, MD.,have documented all relevant documentation on the behalf of Donna Greenland, MD,as directed by  Donna Greenland, MD while in the presence of Donna Greenland, MD.    Subjective:     Patient ID: Donna Duarte , female    DOB: 1961/01/02 , 61 y.o.   MRN: [id number]   Chief Complaint  Patient presents with  . Diabetes  . Hypertension    HPI  Patient is here for DM and HTN check. She reports compliance with meds. Patient has been having some discomfort in her head for about a week now. She reports a dull pain. She also wanted to discuss the blood clot in her leg as well.   Diabetes She presents for her follow-up diabetic visit. She has type 2 diabetes mellitus. Hypoglycemia symptoms include headaches. Pertinent negatives for diabetes include no blurred vision, no polydipsia, no polyphagia and no polyuria. There are no hypoglycemic complications. Risk factors for coronary artery disease include diabetes mellitus, dyslipidemia, hypertension, post-menopausal and sedentary lifestyle. An ACE inhibitor/angiotensin II receptor blocker is being taken.  Hypertension This is a chronic problem. The current episode started more than 1 year ago. The problem has been gradually improving since onset. The problem is controlled. Associated symptoms include headaches. Pertinent negatives include no blurred vision. Risk factors for coronary artery disease include dyslipidemia and obesity. Past treatments include ACE inhibitors. The current treatment provides moderate improvement.     Past Medical History:  Diagnosis Date  . Allergy   . Diabetes mellitus without complication ([place])   . Hyperlipidemia   . Hypertension   . Thyroid disease      Family History  Problem Relation Age of Onset  . Hypertension Mother   . Diabetes Mother      Current Outpatient Medications:  .  atorvastatin (LIPITOR) 10 MG tablet, TAKE 1 TABLET DAILY, Disp: 90  tablet, Rfl: 3 .  Cholecalciferol (VITAMIN D3 PO), Take 1 tablet by mouth daily., Disp: , Rfl:  .  glucose blood (ONETOUCH VERIO) test strip, Use as instructed to check blood sugars twice daily E11.69, Disp: 100 each, Rfl: 12 .  Insulin Pen Needle (PEN NEEDLES) 32G X 4 MM MISC, Use as directed with Ozempic pen, Disp: 100 each, Rfl: 1 .  Lancets (ONETOUCH DELICA PLUS [id number]) MISC, Use as directed to check blood sugars twice daily E11.69, Disp: 100 each, Rfl: 2 .  lisinopril (ZESTRIL) 10 MG tablet, TAKE 1 TABLET DAILY, Disp: 90 tablet, Rfl: 3 .  Multiple Vitamins-Minerals (ZINC PO), Take 1 tablet by mouth daily., Disp: , Rfl:  .  Semaglutide,0.25 or 0.'5MG'$ /DOS, (OZEMPIC, 0.25 OR 0.5 MG/DOSE,) 2 MG/1.5ML SOPN, INJECT 0.5 MG UNDER THE SKIN ONCE WEEKLY (Patient taking differently: Inject 0.5 mg into the skin once a week. Tuesday), Disp: 4.5 mL, Rfl: 3 .  albuterol (VENTOLIN HFA) 108 (90 Base) MCG/ACT inhaler, Inhale into the lungs every 6 (six) hours as needed for wheezing or shortness of breath. (Patient not taking: Reported on 12/20/2021), Disp: , Rfl:  .  APIXABAN (ELIQUIS) VTE STARTER PACK ('10MG'$  AND '5MG'$ ), Take as directed on package: start with two-'5mg'$  tablets twice daily for 7 days. On day 8, switch to one-'5mg'$  tablet twice daily. (Patient not taking: Reported on 12/20/2021), Disp: 74 each, Rfl: 0   Allergies  Allergen Reactions  . Fish Oil Hives and Itching  . Penicillins Hives and Swelling  . Fentanyl Itching     Review of Systems  Constitutional: Negative.   Eyes: Negative.  Negative for blurred vision.  Respiratory: Negative.    Cardiovascular: Negative.   Endocrine: Negative for polydipsia, polyphagia and polyuria.  Musculoskeletal: Negative.   Skin: Negative.   Neurological:  Positive for headaches.  Psychiatric/Behavioral: Negative.       Today's Vitals   12/20/21 1116  BP: (!) 142/90  Pulse: 98  Temp: 98.3 F (36.8 C)  Weight: 213 lb 12.8 oz (97 kg)  Height: '5\' 6"'$  (1.676  m)  PainSc: 0-No pain   Body mass index is 34.51 kg/m.  Wt Readings from Last 3 Encounters:  12/20/21 213 lb 12.8 oz (97 kg)  08/10/21 211 lb 12.8 oz (96.1 kg)  08/03/21 215 lb (97.5 kg)    BP Readings from Last 3 Encounters:  12/20/21 (!) 142/90  08/10/21 140/90  08/06/21 131/76     Objective:  Physical Exam      Assessment And Plan:     1. Parenchymal renal hypertension, stage 1 through stage 4 or unspecified chronic kidney disease  2. Type 2 diabetes mellitus with stage 3a chronic kidney disease, without long-term current use of insulin ([place])  3. Immunization due  4. Class 1 obesity due to excess calories with serious comorbidity and body mass index (BMI) of 34.0 to 34.9 in adult     Patient was given opportunity to ask questions. Patient verbalized understanding of the plan and was able to repeat key elements of the plan. All questions were answered to their satisfaction.  Donna Greenland, MD   I, Donna Greenland, MD, have reviewed all documentation for this visit. The documentation on 12/20/21 for the exam, diagnosis, procedures, and orders are all accurate and complete.   IF YOU HAVE BEEN REFERRED TO A SPECIALIST, IT MAY TAKE 1-2 WEEKS TO SCHEDULE/PROCESS THE REFERRAL. IF YOU HAVE NOT HEARD FROM US/SPECIALIST IN TWO WEEKS, PLEASE GIVE [place] A CALL AT [phone].   THE PATIENT IS ENCOURAGED TO PRACTICE SOCIAL DISTANCING DUE TO THE COVID-19 PANDEMIC.

## 2021-12-21 ENCOUNTER — Ambulatory Visit (HOSPITAL_COMMUNITY)
Admission: RE | Admit: 2021-12-21 | Discharge: 2021-12-21 | Disposition: A | Discharge status: Home / Self Care | Payer: Commercial Managed Care - HMO | Source: Ambulatory Visit | Attending: Vascular Surgery | Admitting: Vascular Surgery

## 2021-12-21 DIAGNOSIS — Z86718 Personal history of other venous thrombosis and embolism: Secondary | ICD-10-CM | POA: Insufficient documentation

## 2021-12-21 LAB — HEMOGLOBIN A1C
Est. average glucose Bld gHb Est-mCnc: 131 mg/dL
Hgb A1c MFr Bld: 6.2 % — ABNORMAL HIGH (ref 4.8–5.6)

## 2021-12-21 LAB — COMPREHENSIVE METABOLIC PANEL
ALT: 17 IU/L (ref 0–32)
AST: 15 IU/L (ref 0–40)
Albumin/Globulin Ratio: 1.5 (ref 1.2–2.2)
Albumin: 4.6 g/dL (ref 3.9–4.9)
Alkaline Phosphatase: 107 IU/L (ref 44–121)
BUN/Creatinine Ratio: 8 — ABNORMAL LOW (ref 12–28)
BUN: 10 mg/dL (ref 8–27)
Bilirubin Total: 0.4 mg/dL (ref 0.0–1.2)
CO2: 25 mmol/L (ref 20–29)
Calcium: 9.9 mg/dL (ref 8.7–10.3)
Chloride: 104 mmol/L (ref 96–106)
Creatinine, Ser: 1.22 mg/dL — ABNORMAL HIGH (ref 0.57–1.00)
Globulin, Total: 3.1 g/dL (ref 1.5–4.5)
Glucose: 78 mg/dL (ref 70–99)
Potassium: 3.8 mmol/L (ref 3.5–5.2)
Sodium: 143 mmol/L (ref 134–144)
Total Protein: 7.7 g/dL (ref 6.0–8.5)
eGFR: 50 mL/min/{1.73_m2} — ABNORMAL LOW (ref >59)

## 2021-12-21 LAB — MICROALBUMIN / CREATININE URINE RATIO
Creatinine, Urine: 240.3 mg/dL
Microalb/Creat Ratio: 17 mg/g creat (ref 0–29)
Microalbumin, Urine: 39.7 ug/mL

## 2021-12-22 ENCOUNTER — Other Ambulatory Visit: Payer: Self-pay

## 2021-12-22 MED ORDER — LISINOPRIL 10 MG PO TABS: 10.0000 mg | ORAL_TABLET | Freq: Every day | ORAL | 3 refills | Status: DC

## 2022-01-06 ENCOUNTER — Other Ambulatory Visit: Payer: Self-pay

## 2022-01-06 ENCOUNTER — Encounter (HOSPITAL_COMMUNITY): Payer: Self-pay | Admitting: Emergency Medicine

## 2022-01-06 ENCOUNTER — Emergency Department (HOSPITAL_COMMUNITY)
Admission: EM | Admit: 2022-01-06 | Discharge: 2022-01-06 | Disposition: A | Discharge status: Home / Self Care | Payer: Commercial Managed Care - HMO | Attending: Emergency Medicine | Admitting: Emergency Medicine

## 2022-01-06 ENCOUNTER — Emergency Department (HOSPITAL_COMMUNITY): Payer: Commercial Managed Care - HMO

## 2022-01-06 DIAGNOSIS — E119 Type 2 diabetes mellitus without complications: Secondary | ICD-10-CM | POA: Insufficient documentation

## 2022-01-06 DIAGNOSIS — I10 Essential (primary) hypertension: Secondary | ICD-10-CM | POA: Diagnosis not present

## 2022-01-06 DIAGNOSIS — Z7984 Long term (current) use of oral hypoglycemic drugs: Secondary | ICD-10-CM | POA: Diagnosis not present

## 2022-01-06 DIAGNOSIS — Z794 Long term (current) use of insulin: Secondary | ICD-10-CM | POA: Insufficient documentation

## 2022-01-06 DIAGNOSIS — Y92410 Unspecified street and highway as the place of occurrence of the external cause: Secondary | ICD-10-CM | POA: Insufficient documentation

## 2022-01-06 DIAGNOSIS — Z79899 Other long term (current) drug therapy: Secondary | ICD-10-CM | POA: Diagnosis not present

## 2022-01-06 DIAGNOSIS — R519 Headache, unspecified: Secondary | ICD-10-CM | POA: Insufficient documentation

## 2022-01-06 DIAGNOSIS — Z7901 Long term (current) use of anticoagulants: Secondary | ICD-10-CM | POA: Diagnosis not present
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
## 2022-01-06 NOTE — Discharge Instructions (Addendum)
Apply ice to any area that is painful.  Ice to be applied for 30 minutes at a time, 4 times a day.  For pain, you may take ibuprofen and/or acetaminophen.  Please be aware that when you combine ibuprofen and acetaminophen, you get better pain relief and you get from taking either medication by itself.
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
## 2022-01-17 ENCOUNTER — Encounter: Payer: BC Managed Care – PPO | Admitting: Internal Medicine

## 2022-03-13 ENCOUNTER — Other Ambulatory Visit: Payer: Self-pay

## 2022-03-13 MED ORDER — OZEMPIC (0.25 OR 0.5 MG/DOSE) 2 MG/1.5ML ~~LOC~~ SOPN: PEN_INJECTOR | SUBCUTANEOUS | 3 refills | Status: DC

## 2022-03-15 ENCOUNTER — Other Ambulatory Visit: Payer: Self-pay

## 2022-03-28 ENCOUNTER — Encounter: Payer: BC Managed Care – PPO | Admitting: Internal Medicine

## 2022-05-02 ENCOUNTER — Ambulatory Visit (INDEPENDENT_AMBULATORY_CARE_PROVIDER_SITE_OTHER): Payer: 59 | Admitting: Internal Medicine

## 2022-05-02 ENCOUNTER — Encounter: Payer: Self-pay | Admitting: Internal Medicine

## 2022-05-02 VITALS — BP 150/88 | Temp 98.1°F | Ht 66.0 in | Wt 211.2 lb

## 2022-05-02 DIAGNOSIS — Z6834 Body mass index (BMI) 34.0-34.9, adult: Secondary | ICD-10-CM

## 2022-05-02 DIAGNOSIS — N1831 Chronic kidney disease, stage 3a: Secondary | ICD-10-CM | POA: Diagnosis not present

## 2022-05-02 DIAGNOSIS — I129 Hypertensive chronic kidney disease with stage 1 through stage 4 chronic kidney disease, or unspecified chronic kidney disease: Secondary | ICD-10-CM | POA: Diagnosis not present

## 2022-05-02 DIAGNOSIS — Z86718 Personal history of other venous thrombosis and embolism: Secondary | ICD-10-CM

## 2022-05-02 DIAGNOSIS — Z0000 Encounter for general adult medical examination without abnormal findings: Secondary | ICD-10-CM

## 2022-05-02 DIAGNOSIS — R82998 Other abnormal findings in urine: Secondary | ICD-10-CM | POA: Diagnosis not present

## 2022-05-02 DIAGNOSIS — E6609 Other obesity due to excess calories: Secondary | ICD-10-CM

## 2022-05-02 DIAGNOSIS — E1122 Type 2 diabetes mellitus with diabetic chronic kidney disease: Secondary | ICD-10-CM | POA: Diagnosis not present

## 2022-05-02 DIAGNOSIS — D6869 Other thrombophilia: Secondary | ICD-10-CM

## 2022-05-02 LAB — POCT URINALYSIS DIPSTICK
Bilirubin, UA: NEGATIVE
Glucose, UA: NEGATIVE
Ketones, UA: NEGATIVE
Leukocytes, UA: NEGATIVE
Nitrite, UA: NEGATIVE
Protein, UA: NEGATIVE
Spec Grav, UA: >=1.03 — AB (ref 1.010–1.025)
Urobilinogen, UA: 0.2 E.U./dL
pH, UA: 6 (ref 5.0–8.0)

## 2022-05-02 NOTE — Progress Notes (Signed)
I,[name],acting as a scribe for [name], MD.,have documented all relevant documentation on the behalf of [name], MD,as directed by  [name], MD while in the presence of [name], MD.   Subjective:     Patient ID: Donna Duarte , female    DOB: 1960/09/17 , 62 y.o.   MRN: [id number]   Chief Complaint  Patient presents with   Annual Exam   Diabetes   Hypertension    HPI  She is here today for a full physical exam. She is followed by [facility], Earnstine Regal for pelvic exams. She reports compliance with meds. She denies headaches, chest pain and shortness of breath. She admits she did not take her meds today. She has a new job at [employer].     Diabetes She presents for her follow-up diabetic visit. She has type 2 diabetes mellitus. There are no hypoglycemic associated symptoms. Pertinent negatives for hypoglycemia include no headaches. Pertinent negatives for diabetes include no blurred vision and no chest pain. There are no hypoglycemic complications. Risk factors for coronary artery disease include diabetes mellitus, dyslipidemia, hypertension, post-menopausal and sedentary lifestyle. An ACE inhibitor/angiotensin II receptor blocker is being taken.  Hypertension This is a chronic problem. The current episode started more than 1 year ago. The problem has been gradually improving since onset. The problem is controlled. Pertinent negatives include no blurred vision, chest pain, headaches, palpitations or shortness of breath. Risk factors for coronary artery disease include dyslipidemia and obesity. Past treatments include ACE inhibitors. The current treatment provides moderate improvement.     Past Medical History:  Diagnosis Date   Allergy    Diabetes mellitus without complication ([place])    Hyperlipidemia    Hypertension    Thyroid disease      Family History  Problem Relation Age of Onset   Hypertension Mother     Diabetes Mother      Current Outpatient Medications:    amLODipine (NORVASC) 2.5 MG tablet, Take 1 tablet (2.5 mg total) by mouth daily. Take w/ evening meal, Disp: 30 tablet, Rfl: 11   atorvastatin (LIPITOR) 10 MG tablet, TAKE 1 TABLET DAILY, Disp: 90 tablet, Rfl: 3   glucose blood (ONETOUCH VERIO) test strip, Use as instructed to check blood sugars twice daily E11.69, Disp: 100 each, Rfl: 12   Insulin Pen Needle (PEN NEEDLES) 32G X 4 MM MISC, Use as directed with Ozempic pen, Disp: 100 each, Rfl: 1   Lancets (ONETOUCH DELICA PLUS Q000111Q) MISC, Use as directed to check blood sugars twice daily E11.69, Disp: 100 each, Rfl: 2   lisinopril (ZESTRIL) 10 MG tablet, Take 1 tablet (10 mg total) by mouth daily., Disp: 90 tablet, Rfl: 3   Semaglutide,0.25 or 0.'5MG'$ /DOS, (OZEMPIC, 0.25 OR 0.5 MG/DOSE,) 2 MG/3ML SOPN, , Disp: , Rfl:    albuterol (VENTOLIN HFA) 108 (90 Base) MCG/ACT inhaler, Inhale into the lungs every 6 (six) hours as needed for wheezing or shortness of breath. (Patient not taking: Reported on 12/20/2021), Disp: , Rfl:    cephALEXin (KEFLEX) 500 MG capsule, One capsule po q12 hours, Disp: 14 capsule, Rfl: 0   Allergies  Allergen Reactions   Fish Oil Hives and Itching   Penicillins Hives and Swelling   Fentanyl Itching      The patient states she uses post menopausal status for birth control. Last LMP was Patient's last menstrual period was 12/15/2012.. Negative for Dysmenorrhea. Negative for: breast discharge, breast lump(s),  breast pain and breast self exam. Associated symptoms include abnormal vaginal bleeding. Pertinent negatives include abnormal bleeding (hematology), anxiety, decreased libido, depression, difficulty falling sleep, dyspareunia, history of infertility, nocturia, sexual dysfunction, sleep disturbances, urinary incontinence, urinary urgency, vaginal discharge and vaginal itching. Diet regular.The patient states her exercise level is  intermittent.   . The patient's  tobacco use is:  Social History   Tobacco Use  Smoking Status Never  Smokeless Tobacco Never  . She has been exposed to passive smoke. The patient's alcohol use is:  Social History   Substance and Sexual Activity  Alcohol Use Yes   Review of Systems  Constitutional: Negative.   HENT: Negative.    Eyes: Negative.  Negative for blurred vision.  Respiratory: Negative.  Negative for shortness of breath.   Cardiovascular: Negative.  Negative for chest pain and palpitations.  Gastrointestinal: Negative.   Endocrine: Negative.   Genitourinary: Negative.   Musculoskeletal: Negative.   Skin: Negative.   Allergic/Immunologic: Negative.   Neurological: Negative.  Negative for headaches.  Hematological: Negative.   Psychiatric/Behavioral: Negative.       Today's Vitals   05/02/22 1410 05/02/22 1433  BP: (!) 156/90 (!) 150/88  Temp: 98.1 F (36.7 C)   SpO2: 98%   Weight: 211 lb 3.2 oz (95.8 kg)   Height: '5\' 6"'$  (1.676 m)    Body mass index is 34.09 kg/m.  Wt Readings from Last 3 Encounters:  05/02/22 211 lb 3.2 oz (95.8 kg)  12/20/21 213 lb 12.8 oz (97 kg)  08/10/21 211 lb 12.8 oz (96.1 kg)    BP Readings from Last 3 Encounters:  05/02/22 (!) 150/88  01/06/22 (!) 166/119  12/20/21 (!) 150/90     Objective:  Physical Exam Vitals and nursing note reviewed.  Constitutional:      Appearance: Normal appearance. She is obese.  HENT:     Head: Normocephalic and atraumatic.     Right Ear: Tympanic membrane, ear canal and external ear normal.     Left Ear: Tympanic membrane, ear canal and external ear normal.     Nose:     Comments: Masked     Mouth/Throat:     Mouth: Mucous membranes are moist.     Comments: Masked  Eyes:     Extraocular Movements: Extraocular movements intact.     Conjunctiva/sclera: Conjunctivae normal.     Pupils: Pupils are equal, round, and reactive to light.  Neck:     Thyroid: Thyromegaly present.  Cardiovascular:     Rate and Rhythm: Normal  rate and regular rhythm.     Pulses: Normal pulses.          Dorsalis pedis pulses are 2+ on the right side and 2+ on the left side.     Heart sounds: Normal heart sounds.  Pulmonary:     Effort: Pulmonary effort is normal.     Breath sounds: Normal breath sounds.  Chest:  Breasts:    Tanner Score is 5.     Right: Normal.     Left: Normal.  Abdominal:     General: Bowel sounds are normal.     Palpations: Abdomen is soft.  Genitourinary:    Comments: deferred Musculoskeletal:        General: Normal range of motion.     Cervical back: Normal range of motion and neck supple.  Feet:     Right foot:     Protective Sensation: 5 sites tested.  5 sites sensed.  Skin integrity: Dry skin present.     Toenail Condition: Right toenails are normal.     Left foot:     Protective Sensation: 5 sites tested.  5 sites sensed.     Skin integrity: Dry skin present.     Toenail Condition: Left toenails are normal.  Skin:    General: Skin is warm and dry.  Neurological:     General: No focal deficit present.     Mental Status: She is alert and oriented to person, place, and time.  Psychiatric:        Mood and Affect: Mood normal.        Behavior: Behavior normal.         Assessment And Plan:     1. Encounter for annual physical exam Comments: A full exam was performed. Importance of monthly self breast exams was d/w patient.  PATIENT IS ADVISED TO GET 30-45 MINUTES REGULAR EXERCISE NO LESS THAN FOUR TO FIVE DAYS PER WEEK - BOTH WEIGHTBEARING EXERCISES AND AEROBIC ARE RECOMMENDED.  PATIENT IS ADVISED TO FOLLOW A HEALTHY DIET WITH AT LEAST SIX FRUITS/VEGGIES PER DAY, DECREASE INTAKE OF RED MEAT, AND TO INCREASE FISH INTAKE TO TWO DAYS PER WEEK.  MEATS/FISH SHOULD NOT BE FRIED, BAKED OR BROILED IS PREFERABLE.  IT IS ALSO IMPORTANT TO CUT BACK ON YOUR SUGAR INTAKE. PLEASE AVOID ANYTHING WITH ADDED SUGAR, CORN SYRUP OR OTHER SWEETENERS. IF YOU MUST USE A SWEETENER, YOU CAN TRY STEVIA. IT IS ALSO  IMPORTANT TO AVOID ARTIFICIALLY SWEETENERS AND DIET BEVERAGES. LASTLY, I SUGGEST WEARING SPF 50 SUNSCREEN ON EXPOSED PARTS AND ESPECIALLY WHEN IN THE DIRECT SUNLIGHT FOR AN EXTENDED PERIOD OF TIME.  PLEASE AVOID FAST FOOD RESTAURANTS AND INCREASE YOUR WATER INTAKE. - CMP14+EGFR - CBC - Hemoglobin A1c - Lipid panel  2. Type 2 diabetes mellitus with stage 3a chronic kidney disease, without long-term current use of insulin (HCC) Comments: Chronic, diabetic  foot exam was performed. Importance of dietary/medication/OV compliance was d/w patient. She will rto in 3-4 months. I DISCUSSED WITH THE PATIENT AT LENGTH REGARDING THE GOALS OF GLYCEMIC CONTROL AND POSSIBLE LONG-TERM COMPLICATIONS.  I  ALSO STRESSED THE IMPORTANCE OF COMPLIANCE WITH HOME GLUCOSE MONITORING, DIETARY RESTRICTIONS INCLUDING AVOIDANCE OF SUGARY DRINKS/PROCESSED FOODS,  ALONG WITH REGULAR EXERCISE.  I  ALSO STRESSED THE IMPORTANCE OF ANNUAL EYE EXAMS, SELF FOOT CARE AND COMPLIANCE WITH OFFICE VISITS.  - POCT Urinalysis Dipstick (81002) - Microalbumin / creatinine urine ratio - CMP14+EGFR - CBC - Hemoglobin A1c - Lipid panel - Semaglutide,0.25 or 0.'5MG'$ /DOS, (OZEMPIC, 0.25 OR 0.5 MG/DOSE,) 2 MG/3ML SOPN  3. Parenchymal renal hypertension, stage 1 through stage 4 or unspecified chronic kidney disease Comments: Uncontrolled, I will increase lisinopril '20mg'$  daily. She can take two tabs until she runs out. I will add amlodipine 2.'5mg'$  in the evenings. She will rto in 2 weeks for nurse visit/BMP.  EKG performed, NSR w/ LAFB and voltage criteria for LVH. Encouraged to follow low sodium diet.  - EKG 12-Lead  4. Leukocytes in urine Comments: I will check urine culture, she is encouraged to stay well hydrated. - Culture, Urine  5. Class 1 obesity due to excess calories with serious comorbidity and body mass index (BMI) of 34.0 to 34.9 in adult Comments: She is encouraged to strive for BMI less than 30 to decrease cardiac risk. Advised to  aim for at least 150 minutes of exercise per week.  Patient was given opportunity to ask questions. Patient verbalized understanding of the  plan and was able to repeat key elements of the plan. All questions were answered to their satisfaction.   I, [name], MD, have reviewed all documentation for this visit. The documentation on 05/02/22 for the exam, diagnosis, procedures, and orders are all accurate and complete.   THE PATIENT IS ENCOURAGED TO PRACTICE SOCIAL DISTANCING DUE TO THE COVID-19 PANDEMIC.

## 2022-05-02 NOTE — Patient Instructions (Signed)

## 2022-05-03 LAB — CMP14+EGFR
ALT: 13 IU/L (ref 0–32)
AST: 14 IU/L (ref 0–40)
Albumin/Globulin Ratio: 1.7 (ref 1.2–2.2)
Albumin: 4.4 g/dL (ref 3.9–4.9)
Alkaline Phosphatase: 108 IU/L (ref 44–121)
BUN/Creatinine Ratio: 12 (ref 12–28)
BUN: 12 mg/dL (ref 8–27)
Bilirubin Total: 0.3 mg/dL (ref 0.0–1.2)
CO2: 24 mmol/L (ref 20–29)
Calcium: 9.6 mg/dL (ref 8.7–10.3)
Chloride: 105 mmol/L (ref 96–106)
Creatinine, Ser: 1.01 mg/dL — ABNORMAL HIGH (ref 0.57–1.00)
Globulin, Total: 2.6 g/dL (ref 1.5–4.5)
Glucose: 157 mg/dL — ABNORMAL HIGH (ref 70–99)
Potassium: 3.5 mmol/L (ref 3.5–5.2)
Sodium: 142 mmol/L (ref 134–144)
Total Protein: 7 g/dL (ref 6.0–8.5)
eGFR: 63 mL/min/{1.73_m2} (ref >59)

## 2022-05-03 LAB — CBC
Hematocrit: 43.2 % (ref 34.0–46.6)
Hemoglobin: 14.1 g/dL (ref 11.1–15.9)
MCH: 28.3 pg (ref 26.6–33.0)
MCHC: 32.6 g/dL (ref 31.5–35.7)
MCV: 87 fL (ref 79–97)
Platelets: 191 10*3/uL (ref 150–450)
RBC: 4.99 x10E6/uL (ref 3.77–5.28)
RDW: 12.9 % (ref 11.7–15.4)
WBC: 5 10*3/uL (ref 3.4–10.8)

## 2022-05-03 LAB — LIPID PANEL
Chol/HDL Ratio: 3.4 ratio (ref 0.0–4.4)
Cholesterol, Total: 145 mg/dL (ref 100–199)
HDL: 43 mg/dL (ref >39)
LDL Chol Calc (NIH): 75 mg/dL (ref 0–99)
Triglycerides: 155 mg/dL — ABNORMAL HIGH (ref 0–149)
VLDL Cholesterol Cal: 27 mg/dL (ref 5–40)

## 2022-05-03 LAB — HEMOGLOBIN A1C
Est. average glucose Bld gHb Est-mCnc: 131 mg/dL
Hgb A1c MFr Bld: 6.2 % — ABNORMAL HIGH (ref 4.8–5.6)

## 2022-05-04 LAB — MICROALBUMIN / CREATININE URINE RATIO
Creatinine, Urine: 183.5 mg/dL
Microalb/Creat Ratio: 33 mg/g creat — ABNORMAL HIGH (ref 0–29)
Microalbumin, Urine: 60.6 ug/mL

## 2022-05-05 DIAGNOSIS — N898 Other specified noninflammatory disorders of vagina: Secondary | ICD-10-CM | POA: Diagnosis not present

## 2022-05-05 DIAGNOSIS — N952 Postmenopausal atrophic vaginitis: Secondary | ICD-10-CM | POA: Diagnosis not present

## 2022-05-06 LAB — URINE CULTURE

## 2022-05-07 ENCOUNTER — Other Ambulatory Visit: Payer: Self-pay | Admitting: Internal Medicine

## 2022-05-09 ENCOUNTER — Other Ambulatory Visit: Payer: Self-pay | Admitting: Internal Medicine

## 2022-05-09 MED ORDER — CEPHALEXIN 500 MG PO CAPS: ORAL_CAPSULE | ORAL | 0 refills | Status: DC

## 2022-05-20 DIAGNOSIS — D6869 Other thrombophilia: Secondary | ICD-10-CM | POA: Insufficient documentation

## 2022-05-20 MED ORDER — AMLODIPINE BESYLATE 2.5 MG PO TABS: 2.5000 mg | ORAL_TABLET | Freq: Every day | ORAL | 11 refills | Status: DC

## 2022-05-23 ENCOUNTER — Ambulatory Visit: Payer: 59

## 2022-05-23 VITALS — BP 130/80 | HR 88 | Temp 98.2°F | Ht 66.0 in | Wt 211.0 lb

## 2022-05-23 DIAGNOSIS — I129 Hypertensive chronic kidney disease with stage 1 through stage 4 chronic kidney disease, or unspecified chronic kidney disease: Secondary | ICD-10-CM

## 2022-05-23 LAB — BMP8+EGFR
BUN/Creatinine Ratio: 12 (ref 12–28)
BUN: 13 mg/dL (ref 8–27)
CO2: 24 mmol/L (ref 20–29)
Calcium: 9.4 mg/dL (ref 8.7–10.3)
Chloride: 106 mmol/L (ref 96–106)
Creatinine, Ser: 1.06 mg/dL — ABNORMAL HIGH (ref 0.57–1.00)
Glucose: 66 mg/dL — ABNORMAL LOW (ref 70–99)
Potassium: 4 mmol/L (ref 3.5–5.2)
Sodium: 142 mmol/L (ref 134–144)
eGFR: 59 mL/min/{1.73_m2} — ABNORMAL LOW (ref >59)

## 2022-05-23 NOTE — Patient Instructions (Signed)
Hypertension, Adult ?Hypertension is another name for high blood pressure. High blood pressure forces your heart to work harder to pump blood. This can cause problems over time. ?There are two numbers in a blood pressure reading. There is a top number (systolic) over a bottom number (diastolic). It is best to have a blood pressure that is below 120/80. ?What are the causes? ?The cause of this condition is not known. Some other conditions can lead to high blood pressure. ?What increases the risk? ?Some lifestyle factors can make you more likely to develop high blood pressure: ?Smoking. ?Not getting enough exercise or physical activity. ?Being overweight. ?Having too much fat, sugar, calories, or salt (sodium) in your diet. ?Drinking too much alcohol. ?Other risk factors include: ?Having any of these conditions: ?Heart disease. ?Diabetes. ?High cholesterol. ?Kidney disease. ?Obstructive sleep apnea. ?Having a family history of high blood pressure and high cholesterol. ?Age. The risk increases with age. ?Stress. ?What are the signs or symptoms? ?High blood pressure may not cause symptoms. Very high blood pressure (hypertensive crisis) may cause: ?Headache. ?Fast or uneven heartbeats (palpitations). ?Shortness of breath. ?Nosebleed. ?Vomiting or feeling like you may vomit (nauseous). ?Changes in how you see. ?Very bad chest pain. ?Feeling dizzy. ?Seizures. ?How is this treated? ?This condition is treated by making healthy lifestyle changes, such as: ?Eating healthy foods. ?Exercising more. ?Drinking less alcohol. ?Your doctor may prescribe medicine if lifestyle changes do not help enough and if: ?Your top number is above 130. ?Your bottom number is above 80. ?Your personal target blood pressure may vary. ?Follow these instructions at home: ?Eating and drinking ? ?If told, follow the DASH eating plan. To follow this plan: ?Fill one half of your plate at each meal with fruits and vegetables. ?Fill one fourth of your plate  at each meal with whole grains. Whole grains include whole-wheat pasta, brown rice, and whole-grain bread. ?Eat or drink low-fat dairy products, such as skim milk or low-fat yogurt. ?Fill one fourth of your plate at each meal with low-fat (lean) proteins. Low-fat proteins include fish, chicken without skin, eggs, beans, and tofu. ?Avoid fatty meat, cured and processed meat, or chicken with skin. ?Avoid pre-made or processed food. ?Limit the amount of salt in your diet to less than 1,500 mg each day. ?Do not drink alcohol if: ?Your doctor tells you not to drink. ?You are pregnant, may be pregnant, or are planning to become pregnant. ?If you drink alcohol: ?Limit how much you have to: ?0-1 drink a day for women. ?0-2 drinks a day for men. ?Know how much alcohol is in your drink. In the [place] one drink equals one 12 oz bottle of beer (355 mL), one 5 oz glass of wine (148 mL), or one 1? oz glass of hard liquor (44 mL). ?Lifestyle ? ?Work with your doctor to stay at a healthy weight or to lose weight. Ask your doctor what the best weight is for you. ?Get at least 30 minutes of exercise that causes your heart to beat faster (aerobic exercise) most days of the week. This may include walking, swimming, or biking. ?Get at least 30 minutes of exercise that strengthens your muscles (resistance exercise) at least 3 days a week. This may include lifting weights or doing Pilates. ?Do not smoke or use any products that contain nicotine or tobacco. If you need help quitting, ask your doctor. ?Check your blood pressure at home as told by your doctor. ?Keep all follow-up visits. ?Medicines ?Take over-the-counter and prescription medicines   only as told by your doctor. Follow directions carefully. ?Do not skip doses of blood pressure medicine. The medicine does not work as well if you skip doses. Skipping doses also puts you at risk for problems. ?Ask your doctor about side effects or reactions to medicines that you should watch  for. ?Contact a doctor if: ?You think you are having a reaction to the medicine you are taking. ?You have headaches that keep coming back. ?You feel dizzy. ?You have swelling in your ankles. ?You have trouble with your vision. ?Get help right away if: ?You get a very bad headache. ?You start to feel mixed up (confused). ?You feel weak or numb. ?You feel faint. ?You have very bad pain in your: ?Chest. ?Belly (abdomen). ?You vomit more than once. ?You have trouble breathing. ?These symptoms may be an emergency. Get help right away. Call 911. ?Do not wait to see if the symptoms will go away. ?Do not drive yourself to the hospital. ?Summary ?Hypertension is another name for high blood pressure. ?High blood pressure forces your heart to work harder to pump blood. ?For most people, a normal blood pressure is less than 120/80. ?Making healthy choices can help lower blood pressure. If your blood pressure does not get lower with healthy choices, you may need to take medicine. ?This information is not intended to replace advice given to you by your health care provider. Make sure you discuss any questions you have with your health care provider. ?Document Revised: 12/30/2020 Document Reviewed: 12/30/2020 ?Elsevier Patient Education ? 2023 [employer] Inc[employer] ? ?

## 2022-05-23 NOTE — Progress Notes (Signed)
Patient presents today for a [place] and lab work. Patient is currently taking amLODipine 2.'5mg'$  PM and lisinopril '10mg'$ . Patient has not yet started the amLODipine she will pick it up today.  BP Readings from Last 3 Encounters:  05/23/22 (!) 130/90  05/02/22 (!) 150/88  01/06/22 (!) 166/119   Per provider-- pls start amlodipine tonight. recheck nurse visit in 2 weeks

## 2022-06-12 ENCOUNTER — Other Ambulatory Visit: Payer: Self-pay

## 2022-06-12 MED ORDER — TRULICITY 0.75 MG/0.5ML ~~LOC~~ SOAJ: 0.7500 mg | SUBCUTANEOUS | 1 refills | Status: DC

## 2022-06-13 ENCOUNTER — Other Ambulatory Visit: Payer: Self-pay

## 2022-06-13 ENCOUNTER — Ambulatory Visit (INDEPENDENT_AMBULATORY_CARE_PROVIDER_SITE_OTHER): Payer: 59

## 2022-06-13 VITALS — BP 160/100 | HR 100 | Temp 97.8°F | Ht 66.0 in | Wt 211.0 lb

## 2022-06-13 DIAGNOSIS — I10 Essential (primary) hypertension: Secondary | ICD-10-CM

## 2022-06-13 MED ORDER — ATORVASTATIN CALCIUM 10 MG PO TABS: 10.0000 mg | ORAL_TABLET | Freq: Every day | ORAL | 3 refills | Status: DC

## 2022-06-13 MED ORDER — LISINOPRIL 20 MG PO TABS: 20.0000 mg | ORAL_TABLET | Freq: Every day | ORAL | 1 refills | Status: DC

## 2022-06-13 MED ORDER — AMLODIPINE BESYLATE 5 MG PO TABS: 2.5000 mg | ORAL_TABLET | Freq: Every day | ORAL | 1 refills | Status: DC

## 2022-06-13 NOTE — Patient Instructions (Signed)
Hypertension, Adult ?Hypertension is another name for high blood pressure. High blood pressure forces your heart to work harder to pump blood. This can cause problems over time. ?There are two numbers in a blood pressure reading. There is a top number (systolic) over a bottom number (diastolic). It is best to have a blood pressure that is below 120/80. ?What are the causes? ?The cause of this condition is not known. Some other conditions can lead to high blood pressure. ?What increases the risk? ?Some lifestyle factors can make you more likely to develop high blood pressure: ?Smoking. ?Not getting enough exercise or physical activity. ?Being overweight. ?Having too much fat, sugar, calories, or salt (sodium) in your diet. ?Drinking too much alcohol. ?Other risk factors include: ?Having any of these conditions: ?Heart disease. ?Diabetes. ?High cholesterol. ?Kidney disease. ?Obstructive sleep apnea. ?Having a family history of high blood pressure and high cholesterol. ?Age. The risk increases with age. ?Stress. ?What are the signs or symptoms? ?High blood pressure may not cause symptoms. Very high blood pressure (hypertensive crisis) may cause: ?Headache. ?Fast or uneven heartbeats (palpitations). ?Shortness of breath. ?Nosebleed. ?Vomiting or feeling like you may vomit (nauseous). ?Changes in how you see. ?Very bad chest pain. ?Feeling dizzy. ?Seizures. ?How is this treated? ?This condition is treated by making healthy lifestyle changes, such as: ?Eating healthy foods. ?Exercising more. ?Drinking less alcohol. ?Your doctor may prescribe medicine if lifestyle changes do not help enough and if: ?Your top number is above 130. ?Your bottom number is above 80. ?Your personal target blood pressure may vary. ?Follow these instructions at home: ?Eating and drinking ? ?If told, follow the DASH eating plan. To follow this plan: ?Fill one half of your plate at each meal with fruits and vegetables. ?Fill one fourth of your plate  at each meal with whole grains. Whole grains include whole-wheat pasta, brown rice, and whole-grain bread. ?Eat or drink low-fat dairy products, such as skim milk or low-fat yogurt. ?Fill one fourth of your plate at each meal with low-fat (lean) proteins. Low-fat proteins include fish, chicken without skin, eggs, beans, and tofu. ?Avoid fatty meat, cured and processed meat, or chicken with skin. ?Avoid pre-made or processed food. ?Limit the amount of salt in your diet to less than 1,500 mg each day. ?Do not drink alcohol if: ?Your doctor tells you not to drink. ?You are pregnant, may be pregnant, or are planning to become pregnant. ?If you drink alcohol: ?Limit how much you have to: ?0-1 drink a day for women. ?0-2 drinks a day for men. ?Know how much alcohol is in your drink. In the [place] one drink equals one 12 oz bottle of beer (355 mL), one 5 oz glass of wine (148 mL), or one 1? oz glass of hard liquor (44 mL). ?Lifestyle ? ?Work with your doctor to stay at a healthy weight or to lose weight. Ask your doctor what the best weight is for you. ?Get at least 30 minutes of exercise that causes your heart to beat faster (aerobic exercise) most days of the week. This may include walking, swimming, or biking. ?Get at least 30 minutes of exercise that strengthens your muscles (resistance exercise) at least 3 days a week. This may include lifting weights or doing Pilates. ?Do not smoke or use any products that contain nicotine or tobacco. If you need help quitting, ask your doctor. ?Check your blood pressure at home as told by your doctor. ?Keep all follow-up visits. ?Medicines ?Take over-the-counter and prescription medicines   only as told by your doctor. Follow directions carefully. ?Do not skip doses of blood pressure medicine. The medicine does not work as well if you skip doses. Skipping doses also puts you at risk for problems. ?Ask your doctor about side effects or reactions to medicines that you should watch  for. ?Contact a doctor if: ?You think you are having a reaction to the medicine you are taking. ?You have headaches that keep coming back. ?You feel dizzy. ?You have swelling in your ankles. ?You have trouble with your vision. ?Get help right away if: ?You get a very bad headache. ?You start to feel mixed up (confused). ?You feel weak or numb. ?You feel faint. ?You have very bad pain in your: ?Chest. ?Belly (abdomen). ?You vomit more than once. ?You have trouble breathing. ?These symptoms may be an emergency. Get help right away. Call 911. ?Do not wait to see if the symptoms will go away. ?Do not drive yourself to the hospital. ?Summary ?Hypertension is another name for high blood pressure. ?High blood pressure forces your heart to work harder to pump blood. ?For most people, a normal blood pressure is less than 120/80. ?Making healthy choices can help lower blood pressure. If your blood pressure does not get lower with healthy choices, you may need to take medicine. ?This information is not intended to replace advice given to you by your health care provider. Make sure you discuss any questions you have with your health care provider. ?Document Revised: 12/30/2020 Document Reviewed: 12/30/2020 ?Elsevier Patient Education ? 2023 [employer] Inc[employer] ? ?

## 2022-06-13 NOTE — Progress Notes (Signed)
Patient presents today for a BP check, patient is taking amLODipine 2.5mg , lisinopril 10mg .  BP Readings from Last 3 Encounters:  06/13/22 (!) 180/100  05/23/22 130/80  05/02/22 (!) 150/88   Today patient is upset about her ozempic not being approved, patient was approved for Trulicity as of [id number] she states she is not picking it up and she does not want it, she only wants ozempic. Patient states she will now try the Trulicity.  Per provider- take 2 lisinopril daily until she runs out. take two amlodipine at night  FOLLOW UP 2 WEEK NV. Increased meds sent.

## 2022-06-27 ENCOUNTER — Ambulatory Visit: Payer: 59

## 2022-06-27 VITALS — BP 136/78 | HR 100 | Temp 98.5°F | Ht 66.0 in | Wt 211.0 lb

## 2022-06-27 DIAGNOSIS — I10 Essential (primary) hypertension: Secondary | ICD-10-CM

## 2022-06-27 NOTE — Progress Notes (Signed)
Patient presents today for bp check, patient currently taking amLODipine 5mg  PM, lisinopril 20mg  AM.  BP Readings from Last 3 Encounters:  06/27/22 (!) 146/70  06/13/22 (!) 160/100  05/23/22 130/80  Per provider- much better. let her know goal is less than 120/80. pls work on exercise and salt restriction  schedule for six weeks from now

## 2022-06-27 NOTE — Patient Instructions (Signed)
Hypertension, Adult ?Hypertension is another name for high blood pressure. High blood pressure forces your heart to work harder to pump blood. This can cause problems over time. ?There are two numbers in a blood pressure reading. There is a top number (systolic) over a bottom number (diastolic). It is best to have a blood pressure that is below 120/80. ?What are the causes? ?The cause of this condition is not known. Some other conditions can lead to high blood pressure. ?What increases the risk? ?Some lifestyle factors can make you more likely to develop high blood pressure: ?Smoking. ?Not getting enough exercise or physical activity. ?Being overweight. ?Having too much fat, sugar, calories, or salt (sodium) in your diet. ?Drinking too much alcohol. ?Other risk factors include: ?Having any of these conditions: ?Heart disease. ?Diabetes. ?High cholesterol. ?Kidney disease. ?Obstructive sleep apnea. ?Having a family history of high blood pressure and high cholesterol. ?Age. The risk increases with age. ?Stress. ?What are the signs or symptoms? ?High blood pressure may not cause symptoms. Very high blood pressure (hypertensive crisis) may cause: ?Headache. ?Fast or uneven heartbeats (palpitations). ?Shortness of breath. ?Nosebleed. ?Vomiting or feeling like you may vomit (nauseous). ?Changes in how you see. ?Very bad chest pain. ?Feeling dizzy. ?Seizures. ?How is this treated? ?This condition is treated by making healthy lifestyle changes, such as: ?Eating healthy foods. ?Exercising more. ?Drinking less alcohol. ?Your doctor may prescribe medicine if lifestyle changes do not help enough and if: ?Your top number is above 130. ?Your bottom number is above 80. ?Your personal target blood pressure may vary. ?Follow these instructions at home: ?Eating and drinking ? ?If told, follow the DASH eating plan. To follow this plan: ?Fill one half of your plate at each meal with fruits and vegetables. ?Fill one fourth of your plate  at each meal with whole grains. Whole grains include whole-wheat pasta, brown rice, and whole-grain bread. ?Eat or drink low-fat dairy products, such as skim milk or low-fat yogurt. ?Fill one fourth of your plate at each meal with low-fat (lean) proteins. Low-fat proteins include fish, chicken without skin, eggs, beans, and tofu. ?Avoid fatty meat, cured and processed meat, or chicken with skin. ?Avoid pre-made or processed food. ?Limit the amount of salt in your diet to less than 1,500 mg each day. ?Do not drink alcohol if: ?Your doctor tells you not to drink. ?You are pregnant, may be pregnant, or are planning to become pregnant. ?If you drink alcohol: ?Limit how much you have to: ?0-1 drink a day for women. ?0-2 drinks a day for men. ?Know how much alcohol is in your drink. In the [place] one drink equals one 12 oz bottle of beer (355 mL), one 5 oz glass of wine (148 mL), or one 1? oz glass of hard liquor (44 mL). ?Lifestyle ? ?Work with your doctor to stay at a healthy weight or to lose weight. Ask your doctor what the best weight is for you. ?Get at least 30 minutes of exercise that causes your heart to beat faster (aerobic exercise) most days of the week. This may include walking, swimming, or biking. ?Get at least 30 minutes of exercise that strengthens your muscles (resistance exercise) at least 3 days a week. This may include lifting weights or doing Pilates. ?Do not smoke or use any products that contain nicotine or tobacco. If you need help quitting, ask your doctor. ?Check your blood pressure at home as told by your doctor. ?Keep all follow-up visits. ?Medicines ?Take over-the-counter and prescription medicines   only as told by your doctor. Follow directions carefully. ?Do not skip doses of blood pressure medicine. The medicine does not work as well if you skip doses. Skipping doses also puts you at risk for problems. ?Ask your doctor about side effects or reactions to medicines that you should watch  for. ?Contact a doctor if: ?You think you are having a reaction to the medicine you are taking. ?You have headaches that keep coming back. ?You feel dizzy. ?You have swelling in your ankles. ?You have trouble with your vision. ?Get help right away if: ?You get a very bad headache. ?You start to feel mixed up (confused). ?You feel weak or numb. ?You feel faint. ?You have very bad pain in your: ?Chest. ?Belly (abdomen). ?You vomit more than once. ?You have trouble breathing. ?These symptoms may be an emergency. Get help right away. Call 911. ?Do not wait to see if the symptoms will go away. ?Do not drive yourself to the hospital. ?Summary ?Hypertension is another name for high blood pressure. ?High blood pressure forces your heart to work harder to pump blood. ?For most people, a normal blood pressure is less than 120/80. ?Making healthy choices can help lower blood pressure. If your blood pressure does not get lower with healthy choices, you may need to take medicine. ?This information is not intended to replace advice given to you by your health care provider. Make sure you discuss any questions you have with your health care provider. ?Document Revised: 12/30/2020 Document Reviewed: 12/30/2020 ?Elsevier Patient Education ? 2023 [employer] Inc[employer] ? ?

## 2022-06-29 ENCOUNTER — Other Ambulatory Visit: Payer: Self-pay

## 2022-06-29 DIAGNOSIS — N1831 Chronic kidney disease, stage 3a: Secondary | ICD-10-CM

## 2022-06-29 MED ORDER — ONETOUCH DELICA PLUS LANCET30G MISC
2 refills | Status: AC
  Filled 2023-01-08: qty 100, 50d supply, fill #0

## 2022-06-29 MED ORDER — ONETOUCH VERIO VI STRP
ORAL_STRIP | 12 refills | Status: AC
  Filled 2023-01-08: qty 100, 50d supply, fill #0

## 2022-08-01 ENCOUNTER — Other Ambulatory Visit: Payer: Self-pay | Admitting: Internal Medicine

## 2022-08-02 DIAGNOSIS — Z1231 Encounter for screening mammogram for malignant neoplasm of breast: Secondary | ICD-10-CM | POA: Diagnosis not present

## 2022-08-02 DIAGNOSIS — Z01419 Encounter for gynecological examination (general) (routine) without abnormal findings: Secondary | ICD-10-CM | POA: Diagnosis not present

## 2022-08-02 DIAGNOSIS — Z1211 Encounter for screening for malignant neoplasm of colon: Secondary | ICD-10-CM | POA: Diagnosis not present

## 2022-08-02 DIAGNOSIS — Z124 Encounter for screening for malignant neoplasm of cervix: Secondary | ICD-10-CM | POA: Diagnosis not present

## 2022-08-02 DIAGNOSIS — Z6835 Body mass index (BMI) 35.0-35.9, adult: Secondary | ICD-10-CM | POA: Diagnosis not present

## 2022-08-08 ENCOUNTER — Ambulatory Visit (INDEPENDENT_AMBULATORY_CARE_PROVIDER_SITE_OTHER): Payer: 59 | Admitting: Internal Medicine

## 2022-08-08 ENCOUNTER — Encounter: Payer: Self-pay | Admitting: Internal Medicine

## 2022-08-08 VITALS — BP 130/82 | HR 85 | Temp 98.1°F | Ht 66.0 in | Wt 213.8 lb

## 2022-08-08 DIAGNOSIS — E1122 Type 2 diabetes mellitus with diabetic chronic kidney disease: Secondary | ICD-10-CM

## 2022-08-08 DIAGNOSIS — N1832 Chronic kidney disease, stage 3b: Secondary | ICD-10-CM | POA: Insufficient documentation

## 2022-08-08 DIAGNOSIS — N1831 Chronic kidney disease, stage 3a: Secondary | ICD-10-CM | POA: Diagnosis not present

## 2022-08-08 DIAGNOSIS — I129 Hypertensive chronic kidney disease with stage 1 through stage 4 chronic kidney disease, or unspecified chronic kidney disease: Secondary | ICD-10-CM

## 2022-08-08 DIAGNOSIS — Z6834 Body mass index (BMI) 34.0-34.9, adult: Secondary | ICD-10-CM

## 2022-08-08 DIAGNOSIS — E6609 Other obesity due to excess calories: Secondary | ICD-10-CM | POA: Diagnosis not present

## 2022-08-08 MED ORDER — TRULICITY 1.5 MG/0.5ML ~~LOC~~ SOAJ: 1.5000 mg | SUBCUTANEOUS | 0 refills | Status: DC

## 2022-08-08 NOTE — Progress Notes (Signed)
I,[name],acting as a scribe for [name], MD.,have documented all relevant documentation on the behalf of [name], MD,as directed by  [name], MD while in the presence of [name], MD.    Subjective:     Patient ID: Donna Duarte , female    DOB: 1961-01-17 , 62 y.o.   MRN: [id number]   Chief Complaint  Patient presents with   Diabetes   Hypertension    HPI  Patient is here for DM and HTN check. She reports compliance with meds. She denies visual disturbances and dizziness & headache. She reports no specific questions or concerns.   She reports completing pap & mammogram last week. Letter sent to Henreitta Leber to request results.   She reports she will complete her DM eye exam next week.   Diabetes She presents for her follow-up diabetic visit. She has type 2 diabetes mellitus. Her disease course has been stable. Hypoglycemia symptoms include headaches. Pertinent negatives for diabetes include no blurred vision, no polydipsia, no polyphagia and no polyuria. There are no hypoglycemic complications. Risk factors for coronary artery disease include diabetes mellitus, dyslipidemia, hypertension, post-menopausal and sedentary lifestyle. An ACE inhibitor/angiotensin II receptor blocker is being taken.  Hypertension This is a chronic problem. The current episode started more than 1 year ago. The problem has been gradually improving since onset. The problem is controlled. Associated symptoms include headaches. Pertinent negatives include no blurred vision. Risk factors for coronary artery disease include dyslipidemia and obesity. Past treatments include ACE inhibitors. The current treatment provides moderate improvement.     Past Medical History:  Diagnosis Date   Allergy    Diabetes mellitus without complication (HCC)    Hyperlipidemia    Hypertension    Thyroid disease      Family History  Problem Relation Age of Onset   Hypertension Mother     Diabetes Mother      Current Outpatient Medications:    amLODipine (NORVASC) 5 MG tablet, Take 0.5 tablets (2.5 mg total) by mouth daily. Take w/ evening meal, Disp: 90 tablet, Rfl: 1   atorvastatin (LIPITOR) 10 MG tablet, Take 1 tablet (10 mg total) by mouth daily., Disp: 90 tablet, Rfl: 3   Dulaglutide (TRULICITY) 1.5 MG/0.5ML SOPN, Inject 1.5 mg into the skin once a week., Disp: 2 mL, Rfl: 0   glucose blood (ONETOUCH VERIO) test strip, Use as instructed to check blood sugars twice daily E11.69, Disp: 100 each, Rfl: 12   Insulin Pen Needle (PEN NEEDLES) 32G X 4 MM MISC, Use as directed with Ozempic pen, Disp: 100 each, Rfl: 1   Lancets (ONETOUCH DELICA PLUS LANCET30G) MISC, Use as directed to check blood sugars twice daily E11.69, Disp: 100 each, Rfl: 2   lisinopril (ZESTRIL) 20 MG tablet, Take 1 tablet (20 mg total) by mouth daily., Disp: 90 tablet, Rfl: 1   albuterol (VENTOLIN HFA) 108 (90 Base) MCG/ACT inhaler, Inhale into the lungs every 6 (six) hours as needed for wheezing or shortness of breath. (Patient not taking: Reported on 12/20/2021), Disp: , Rfl:    Allergies  Allergen Reactions   Fish Oil Hives and Itching   Penicillins Hives and Swelling   Fentanyl Itching     Review of Systems  Constitutional: Negative.   Eyes:  Negative for blurred vision.  Respiratory: Negative.    Cardiovascular: Negative.   Gastrointestinal: Negative.   Endocrine: Negative for polydipsia, polyphagia and polyuria.  Musculoskeletal: Negative.   Skin: Negative.  Neurological:  Positive for headaches.  Psychiatric/Behavioral: Negative.       Today's Vitals   08/08/22 1200  BP: 130/82  Pulse: 85  Temp: 98.1 F (36.7 C)  SpO2: 98%  Weight: 213 lb 12.8 oz (97 kg)  Height: 5\' 6"  (1.676 m)   Wt Readings from Last 3 Encounters:  08/08/22 213 lb 12.8 oz (97 kg)  06/27/22 211 lb (95.7 kg)  06/13/22 211 lb (95.7 kg)    Body mass index is 34.51 kg/m.  The 10-year ASCVD risk score (Arnett  DK, et al., 2019) is: 15.8%   Values used to calculate the score:     Age: 58 years     Sex: Female     Is Non-Hispanic African American: Yes     Diabetic: Yes     Tobacco smoker: No     Systolic Blood Pressure: 130 mmHg     Is BP treated: Yes     HDL Cholesterol: 43 mg/dL     Total Cholesterol: 145 mg/dL ++ Objective:  Physical Exam Vitals and nursing note reviewed.  Constitutional:      Appearance: Normal appearance. She is obese.  HENT:     Head: Normocephalic and atraumatic.  Eyes:     Extraocular Movements: Extraocular movements intact.  Cardiovascular:     Rate and Rhythm: Normal rate and regular rhythm.     Heart sounds: Normal heart sounds.  Pulmonary:     Effort: Pulmonary effort is normal.     Breath sounds: Normal breath sounds.  Musculoskeletal:     Cervical back: Normal range of motion.  Skin:    General: Skin is warm.  Neurological:     General: No focal deficit present.     Mental Status: She is alert.  Psychiatric:        Mood and Affect: Mood normal.        Behavior: Behavior normal.      Assessment And Plan:     1. Type 2 diabetes mellitus with stage 3a chronic kidney disease, without long-term current use of insulin (HCC) Comments: Chronic, I will check labs as below. Reminded to avoid NSAIDs, stay well hydrated and keep BP/BS controlled to decrease risk of CKD progression. - CMP14+EGFR - Hemoglobin A1c  2. Parenchymal renal hypertension, stage 1 through stage 4 or unspecified chronic kidney disease Comments: Chronic, fair control. She will c/w lisinopril 20mg  and amlodipine 5mg  daily. If elevated at next visit, I will increase amlodipine to 10mg  daily.  3. Class 1 obesity due to excess calories with serious comorbidity and body mass index (BMI) of 34.0 to 34.9 in adult She is encouraged to strive for BMI less than 30 to decrease cardiac risk. Advised to aim for at least 150 minutes of exercise per week.   Return if symptoms worsen or fail to  improve.   Patient was given opportunity to ask questions. Patient verbalized understanding of the plan and was able to repeat key elements of the plan. All questions were answered to their satisfaction.   I, [name], MD, have reviewed all documentation for this visit. The documentation on 08/08/22 for the exam, diagnosis, procedures, and orders are all accurate and complete.   IF YOU HAVE BEEN REFERRED TO A SPECIALIST, IT MAY TAKE 1-2 WEEKS TO SCHEDULE/PROCESS THE REFERRAL. IF YOU HAVE NOT HEARD FROM US/SPECIALIST IN TWO WEEKS, PLEASE GIVE [place] A CALL AT [phone].   THE PATIENT IS ENCOURAGED TO PRACTICE SOCIAL DISTANCING DUE TO  THE COVID-19 PANDEMIC.

## 2022-08-08 NOTE — Patient Instructions (Signed)

## 2022-08-09 LAB — CMP14+EGFR
ALT: 19 IU/L (ref 0–32)
AST: 16 IU/L (ref 0–40)
Albumin/Globulin Ratio: 1.6 (ref 1.2–2.2)
Albumin: 4.3 g/dL (ref 3.9–4.9)
Alkaline Phosphatase: 97 IU/L (ref 44–121)
BUN/Creatinine Ratio: 12 (ref 12–28)
BUN: 12 mg/dL (ref 8–27)
Bilirubin Total: 0.3 mg/dL (ref 0.0–1.2)
CO2: 24 mmol/L (ref 20–29)
Calcium: 9.6 mg/dL (ref 8.7–10.3)
Chloride: 107 mmol/L — ABNORMAL HIGH (ref 96–106)
Creatinine, Ser: 1.02 mg/dL — ABNORMAL HIGH (ref 0.57–1.00)
Globulin, Total: 2.7 g/dL (ref 1.5–4.5)
Glucose: 72 mg/dL (ref 70–99)
Potassium: 4.1 mmol/L (ref 3.5–5.2)
Sodium: 144 mmol/L (ref 134–144)
Total Protein: 7 g/dL (ref 6.0–8.5)
eGFR: 62 mL/min/{1.73_m2} (ref >59)

## 2022-08-09 LAB — HEMOGLOBIN A1C
Est. average glucose Bld gHb Est-mCnc: 128 mg/dL
Hgb A1c MFr Bld: 6.1 % — ABNORMAL HIGH (ref 4.8–5.6)

## 2022-08-10 LAB — HM PAP SMEAR

## 2022-08-15 DIAGNOSIS — H538 Other visual disturbances: Secondary | ICD-10-CM | POA: Diagnosis not present

## 2022-08-15 DIAGNOSIS — E1165 Type 2 diabetes mellitus with hyperglycemia: Secondary | ICD-10-CM | POA: Diagnosis not present

## 2022-09-04 ENCOUNTER — Other Ambulatory Visit: Payer: Self-pay | Admitting: Internal Medicine

## 2022-09-04 DIAGNOSIS — Z1231 Encounter for screening mammogram for malignant neoplasm of breast: Secondary | ICD-10-CM

## 2022-09-24 IMAGING — CT CT ANGIO CHEST
2 of 6 series · 18 of 36 positions shown · IV contrast (OMNIPAQUE 350)
Comparison: None Available.

CLINICAL DATA: Shortness of breath and cough.

EXAM:
CT ANGIOGRAPHY CHEST WITH CONTRAST
TECHNIQUE: Multidetector CT imaging of the chest was performed using the
standard protocol during bolus administration of intravenous
contrast. Multiplanar CT image reconstructions and MIPs were
obtained to evaluate the vascular anatomy.

[Series 5: thins · axial · 0.75mm/px · z∈[-284,-49]mm · 17 of 265 slices shown]
[im 15/265  lung]
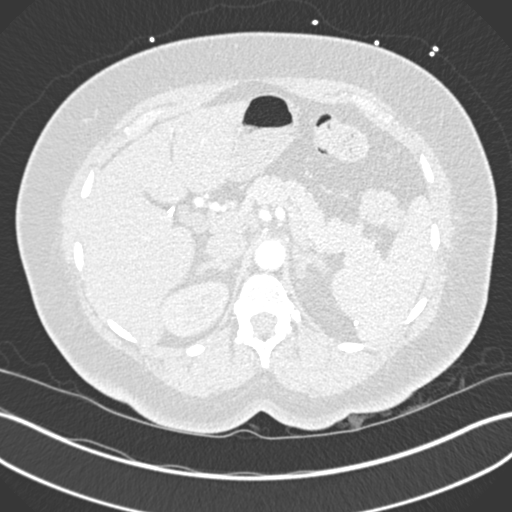
[im 30/265  mediastinal]
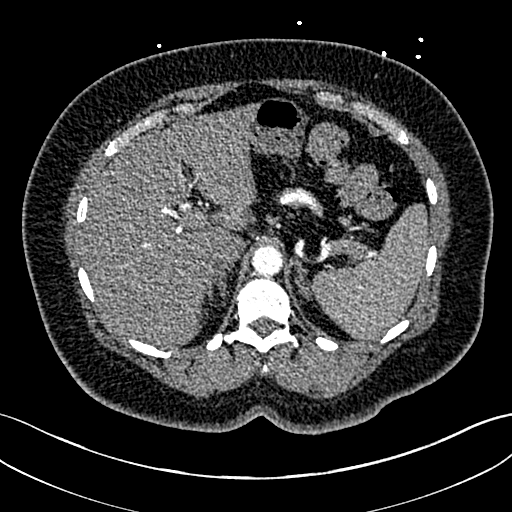
[im 45/265  lung]
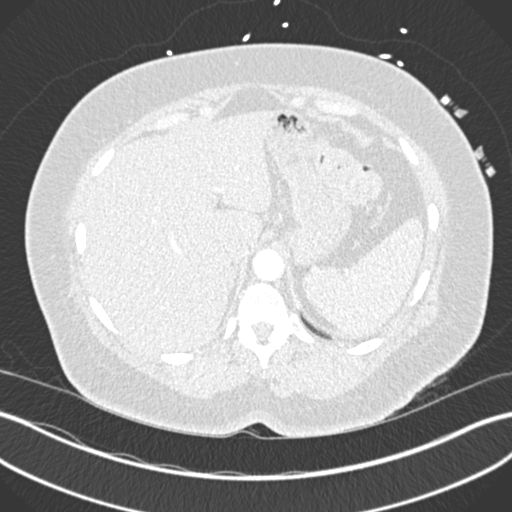
[im 59/265  mediastinal]
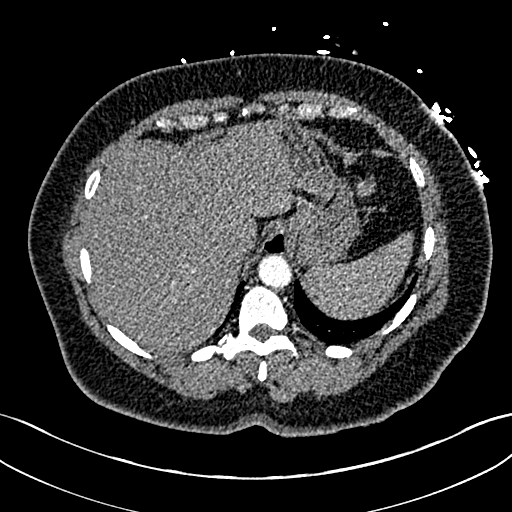
[im 74/265  lung]
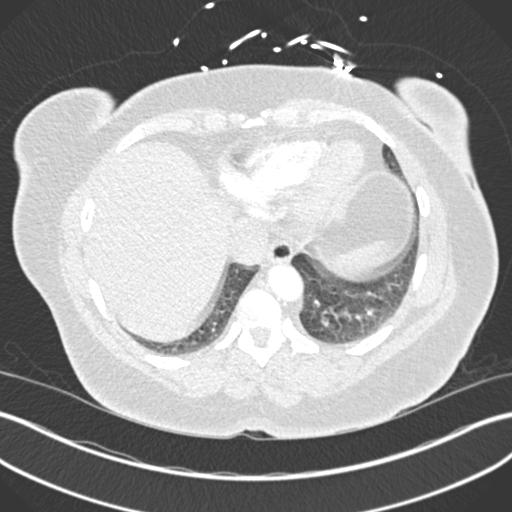
[im 89/265  mediastinal]
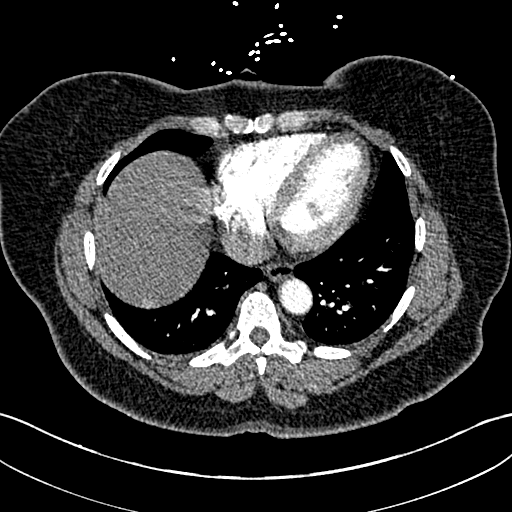
[im 103/265  lung]
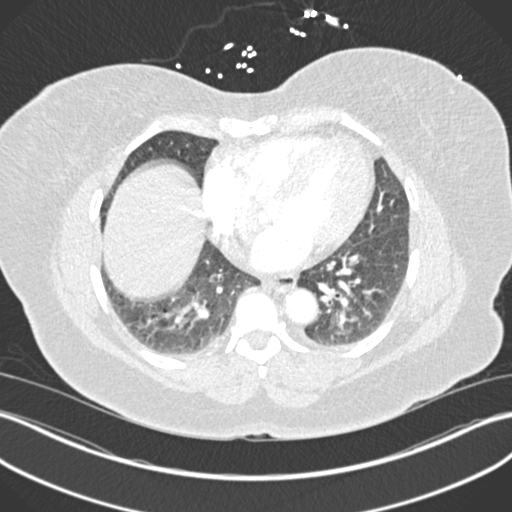
[im 118/265  mediastinal]
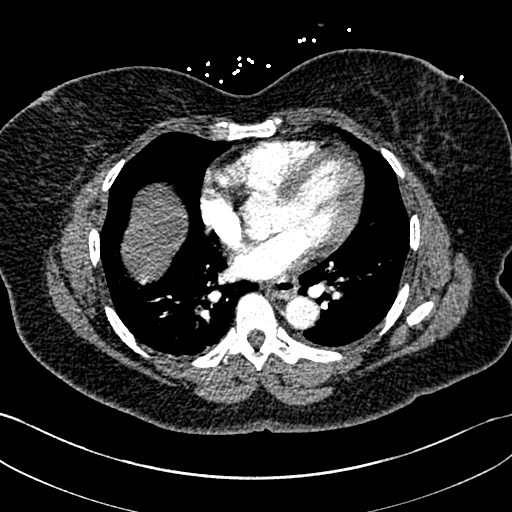
[im 133/265  lung]
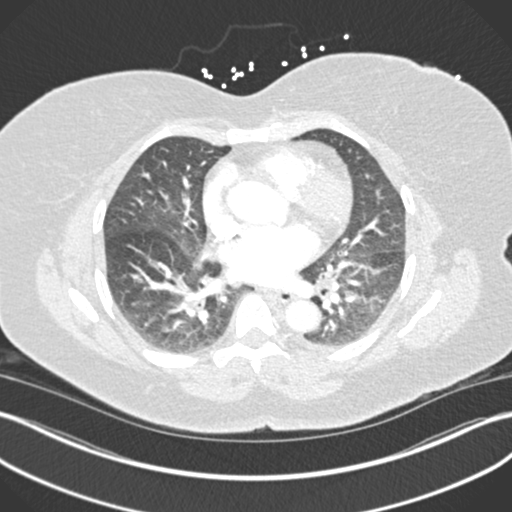
[im 147/265  mediastinal]
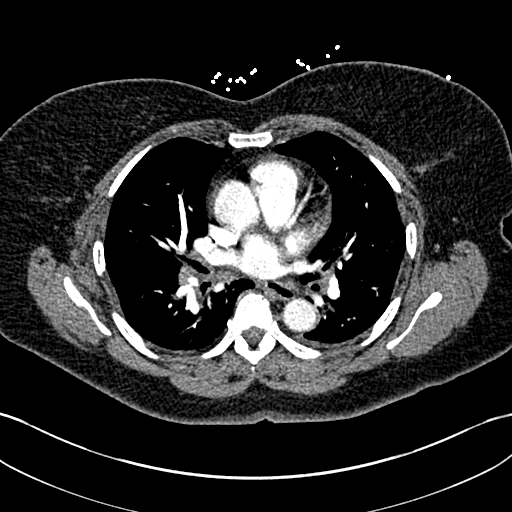
[im 162/265  lung]
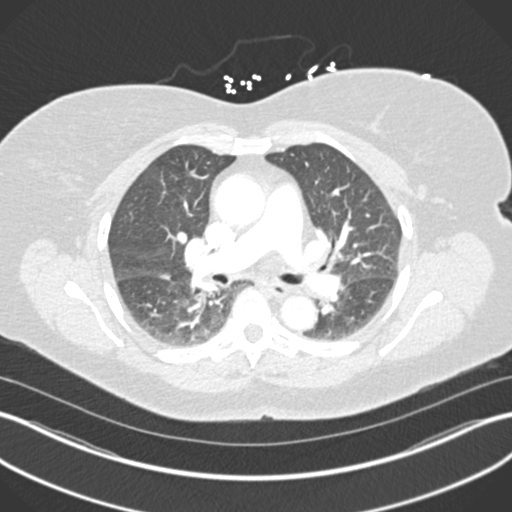
[im 177/265  mediastinal]
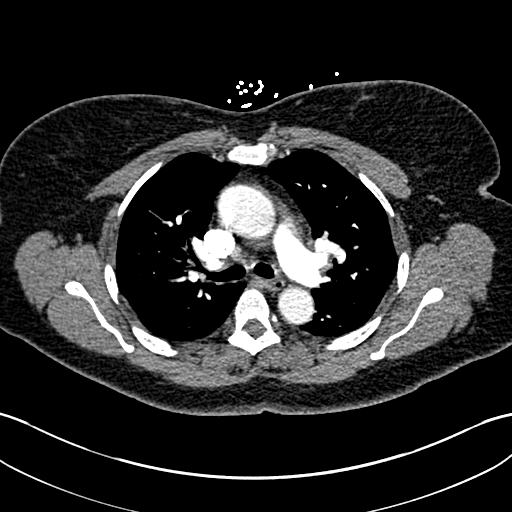
[im 191/265  lung]
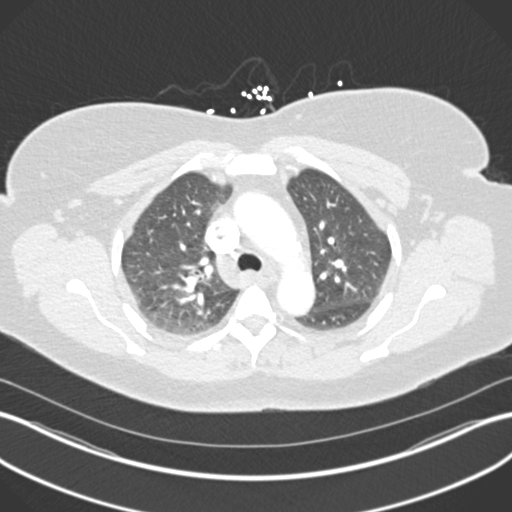
[im 206/265  mediastinal]
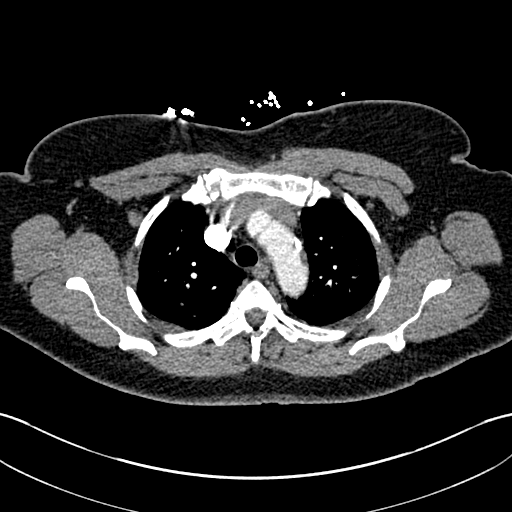
[im 221/265  lung]
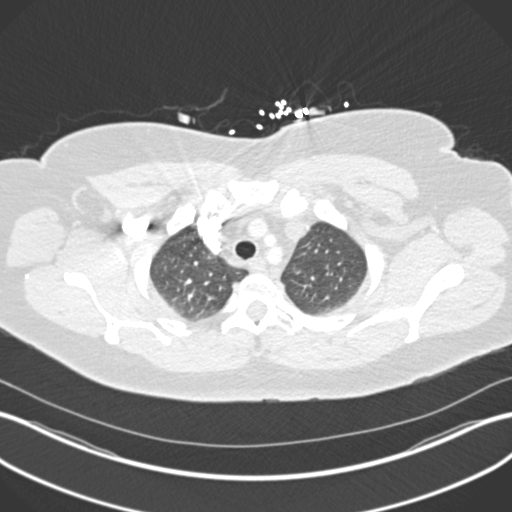
[im 235/265  mediastinal]
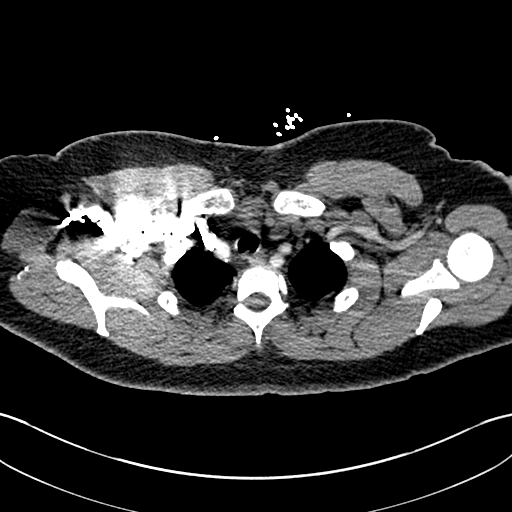
[im 250/265  lung]
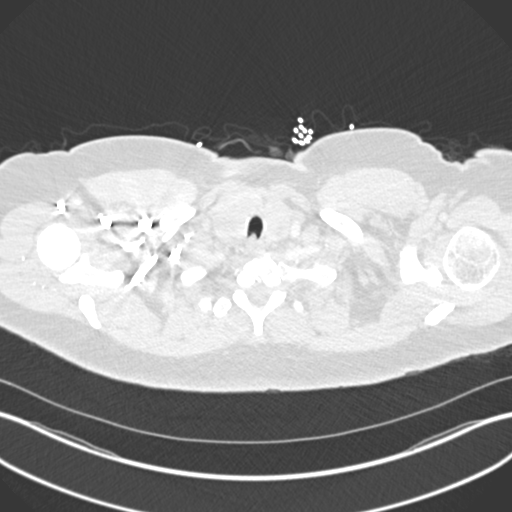

[Series 6: coronal mpr · coronal · 0.55mm/px · 1 of 154 slices shown]
[im 77/154  mediastinal]
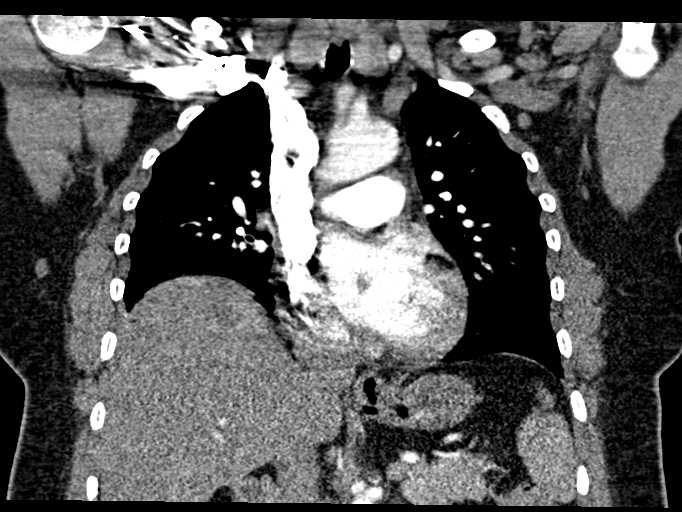

[18 of 36 positions shown; findings below may reference images not displayed]

RADIATION DOSE REDUCTION: This exam was performed according to the
departmental dose-optimization program which includes automated
exposure control, adjustment of the mA and/or kV according to
patient size and/or use of iterative reconstruction technique.

CONTRAST:  80mL OMNIPAQUE IOHEXOL 350 MG/ML SOLN
FINDINGS: Cardiovascular: The heart size is normal. No substantial pericardial
effusion. Mild atherosclerotic calcification is noted in the wall of
the thoracic aorta. There is no filling defect within the opacified
pulmonary arteries to suggest the presence of an acute pulmonary
embolus.

Mediastinum/Nodes: No mediastinal lymphadenopathy. Upper normal
right hilar node at 11 mm short axis. No left hilar lymphadenopathy.
The esophagus has normal imaging features. There is no axillary
lymphadenopathy. Asymmetric enlargement right thyroid lobe. 13 mm
left thyroid nodule evident. This has been evaluated on previous
imaging. (ref: [HOSPITAL]. [DATE]): 143-50).There is no
axillary lymphadenopathy.

Lungs/Pleura: No suspicious pulmonary nodule or mass. No focal
airspace consolidation. There is no evidence of pleural effusion.

Upper Abdomen: Unremarkable.

Musculoskeletal: No worrisome lytic or sclerotic osseous
abnormality.

Review of the MIP images confirms the above findings.
IMPRESSION: 1. No CT evidence for acute pulmonary embolus.
2. No suspicious pulmonary nodule or mass.
3. Aortic Atherosclerosis ([id number]).

## 2022-09-25 ENCOUNTER — Ambulatory Visit: Payer: Commercial Managed Care - HMO | Admitting: Internal Medicine

## 2022-10-10 ENCOUNTER — Other Ambulatory Visit: Payer: Self-pay

## 2022-10-10 DIAGNOSIS — E1122 Type 2 diabetes mellitus with diabetic chronic kidney disease: Secondary | ICD-10-CM

## 2022-10-10 MED ORDER — MOUNJARO 5 MG/0.5ML ~~LOC~~ SOAJ
5.0000 mg | SUBCUTANEOUS | 1 refills | Status: DC
  Filled 2022-11-13: qty 2, 28d supply, fill #0

## 2022-11-13 ENCOUNTER — Other Ambulatory Visit (HOSPITAL_COMMUNITY): Payer: Self-pay

## 2022-11-14 ENCOUNTER — Other Ambulatory Visit (HOSPITAL_COMMUNITY): Payer: Self-pay

## 2022-11-15 NOTE — Patient Instructions (Incomplete)

## 2022-11-16 ENCOUNTER — Ambulatory Visit: Payer: 59 | Admitting: Internal Medicine

## 2022-11-16 ENCOUNTER — Encounter: Payer: Self-pay | Admitting: Internal Medicine

## 2022-11-16 ENCOUNTER — Other Ambulatory Visit (HOSPITAL_COMMUNITY): Payer: Self-pay

## 2022-11-16 VITALS — BP 126/88 | HR 89 | Temp 98.0°F | Ht 66.0 in | Wt 215.0 lb

## 2022-11-16 DIAGNOSIS — I129 Hypertensive chronic kidney disease with stage 1 through stage 4 chronic kidney disease, or unspecified chronic kidney disease: Secondary | ICD-10-CM | POA: Diagnosis not present

## 2022-11-16 DIAGNOSIS — Z6834 Body mass index (BMI) 34.0-34.9, adult: Secondary | ICD-10-CM | POA: Diagnosis not present

## 2022-11-16 DIAGNOSIS — Z1231 Encounter for screening mammogram for malignant neoplasm of breast: Secondary | ICD-10-CM | POA: Diagnosis not present

## 2022-11-16 DIAGNOSIS — E1122 Type 2 diabetes mellitus with diabetic chronic kidney disease: Secondary | ICD-10-CM | POA: Diagnosis not present

## 2022-11-16 DIAGNOSIS — E6609 Other obesity due to excess calories: Secondary | ICD-10-CM

## 2022-11-16 DIAGNOSIS — N1831 Chronic kidney disease, stage 3a: Secondary | ICD-10-CM | POA: Diagnosis not present

## 2022-11-16 MED ORDER — MOUNJARO 7.5 MG/0.5ML ~~LOC~~ SOAJ
7.5000 mg | SUBCUTANEOUS | 1 refills | Status: DC
  Filled 2022-11-16: qty 2, 28d supply, fill #0

## 2022-11-16 NOTE — Progress Notes (Signed)
I,[name], CMA,acting as a Neurosurgeon for Donna Aliment, MD.,have documented all relevant documentation on the behalf of Donna Aliment, MD,as directed by  Donna Aliment, MD while in the presence of Donna Aliment, MD.  Subjective:  Patient ID: Donna Duarte , female    DOB: 09-19-1960 , 62 y.o.   MRN: [id number]  Chief Complaint  Patient presents with   Diabetes   Hypertension    HPI  Patient is here for DM and HTN check. She reports compliance with meds. She denies visual disturbances, dizziness & headache. She denies having any specific questions or concerns. She reports completing diabetic eye exam.  She is followed by [facility], Dr. Karleen Hampshire.     Diabetes She presents for her follow-up diabetic visit. She has type 2 diabetes mellitus. Her disease course has been stable. Pertinent negatives for diabetes include no blurred vision, no polydipsia, no polyphagia and no polyuria. There are no hypoglycemic complications. Risk factors for coronary artery disease include diabetes mellitus, dyslipidemia, hypertension, post-menopausal and sedentary lifestyle. An ACE inhibitor/angiotensin II receptor blocker is being taken.  Hypertension This is a chronic problem. The current episode started more than 1 year ago. The problem has been gradually improving since onset. The problem is controlled. Pertinent negatives include no blurred vision. Risk factors for coronary artery disease include dyslipidemia and obesity. Past treatments include ACE inhibitors. The current treatment provides moderate improvement.     Past Medical History:  Diagnosis Date   Allergy    Diabetes mellitus without complication (HCC)    Hyperlipidemia    Hypertension    Thyroid disease      Family History  Problem Relation Age of Onset   Hypertension Mother    Diabetes Mother      Current Outpatient Medications:    amLODipine (NORVASC) 5 MG tablet, Take 0.5 tablets (2.5 mg total) by mouth daily. Take  w/ evening meal, Disp: 90 tablet, Rfl: 1   atorvastatin (LIPITOR) 10 MG tablet, Take 1 tablet (10 mg total) by mouth daily., Disp: 90 tablet, Rfl: 3   glucose blood (ONETOUCH VERIO) test strip, Check blood sugar twice daily., Disp: 100 each, Rfl: 12   Insulin Pen Needle (PEN NEEDLES) 32G X 4 MM MISC, Use as directed with Ozempic pen, Disp: 100 each, Rfl: 1   Lancets (ONETOUCH DELICA PLUS LANCET30G) MISC, Use as directed to check blood sugars twice daily., Disp: 100 each, Rfl: 2   lisinopril (ZESTRIL) 20 MG tablet, Take 1 tablet (20 mg total) by mouth daily., Disp: 90 tablet, Rfl: 1   tirzepatide (MOUNJARO) 7.5 MG/0.5ML Pen, Inject 7.5 mg into the skin once a week., Disp: 2 mL, Rfl: 1   albuterol (VENTOLIN HFA) 108 (90 Base) MCG/ACT inhaler, Inhale into the lungs every 6 (six) hours as needed for wheezing or shortness of breath. (Patient not taking: Reported on 12/20/2021), Disp: , Rfl:    Allergies  Allergen Reactions   Fish Oil Hives and Itching   Penicillins Hives and Swelling   Fentanyl Itching     Review of Systems  Constitutional: Negative.   Eyes:  Negative for blurred vision.  Respiratory: Negative.    Cardiovascular: Negative.   Gastrointestinal: Negative.   Endocrine: Negative for polydipsia, polyphagia and polyuria.  Skin: Negative.   Psychiatric/Behavioral: Negative.       Today's Vitals   11/16/22 1513 11/16/22 1534  BP: (!) 132/90 126/88  Pulse: 89   Temp: 98 F (36.7 C)   SpO2:  98%   Weight: 215 lb (97.5 kg)   Height: 5\' 6"  (1.676 m)    Body mass index is 34.7 kg/m.  Wt Readings from Last 3 Encounters:  11/16/22 215 lb (97.5 kg)  08/08/22 213 lb 12.8 oz (97 kg)  06/27/22 211 lb (95.7 kg)    BP Readings from Last 3 Encounters:  11/16/22 126/88  08/08/22 130/82  06/27/22 136/78     Objective:  Physical Exam Vitals and nursing note reviewed.  Constitutional:      Appearance: Normal appearance. She is obese.  HENT:     Head: Normocephalic and  atraumatic.  Eyes:     Extraocular Movements: Extraocular movements intact.  Cardiovascular:     Rate and Rhythm: Normal rate and regular rhythm.     Heart sounds: Normal heart sounds.  Pulmonary:     Effort: Pulmonary effort is normal.     Breath sounds: Normal breath sounds.  Musculoskeletal:     Cervical back: Normal range of motion.  Skin:    General: Skin is warm.  Neurological:     General: No focal deficit present.     Mental Status: She is alert.  Psychiatric:        Mood and Affect: Mood normal.        Behavior: Behavior normal.         Assessment And Plan:  Type 2 diabetes mellitus with stage 3a chronic kidney disease, without long-term current use of insulin (HCC) Assessment & Plan: Chronic, I will send rx Mounjaro 7.5mg  weekly. She is encouraged to incorporate more exercise into her daily routine. She will rto in 3-4 months for re-evaluation. Chronic, she is encouraged to stay well hydrated, avoid NSAIDs and keep BP controlled to prevent progression of CKD.    Orders: -     Hemoglobin A1c -     BMP8+EGFR -     Protein electrophoresis, serum  Parenchymal renal hypertension, stage 1 through stage 4 or unspecified chronic kidney disease Assessment & Plan: Chronic, diastolic elevation noted. She is encouraged to decrease her sodium intake and her intake of packaged/processed foods. For now, she will continue with lisinopril 20mg  daily and amlodipine 2.5mg  nightly. May need to increase amlodipine to 5mg  nightly.   Orders: -     BMP8+EGFR  Class 1 obesity due to excess calories with serious comorbidity and body mass index (BMI) of 34.0 to 34.9 in adult Assessment & Plan: She is encouraged to strive for BMI less than 30 to decrease cardiac risk. Advised to aim for at least 150 minutes of exercise per week.    Breast cancer screening by mammogram Assessment & Plan: She is encouraged to schedule mammogram in the near future.    Other orders -     Mounjaro;  Inject 7.5 mg into the skin once a week.  Dispense: 2 mL; Refill: 1  She is encouraged to strive for BMI less than 30 to decrease cardiac risk. Advised to aim for at least 150 minutes of exercise per week.    Return in 3 months (on 02/16/2023), or dm check/ virtual.  Patient was given opportunity to ask questions. Patient verbalized understanding of the plan and was able to repeat key elements of the plan. All questions were answered to their satisfaction.    I, Donna Aliment, MD, have reviewed all documentation for this visit. The documentation on 11/16/22 for the exam, diagnosis, procedures, and orders are all accurate and complete.   IF YOU HAVE BEEN  REFERRED TO A SPECIALIST, IT MAY TAKE 1-2 WEEKS TO SCHEDULE/PROCESS THE REFERRAL. IF YOU HAVE NOT HEARD FROM US/SPECIALIST IN TWO WEEKS, PLEASE GIVE [place] A CALL AT [phone].   THE PATIENT IS ENCOURAGED TO PRACTICE SOCIAL DISTANCING DUE TO THE COVID-19 PANDEMIC.

## 2022-11-20 LAB — PROTEIN ELECTROPHORESIS, SERUM
A/G Ratio: 1.1 (ref 0.7–1.7)
Albumin ELP: 3.9 g/dL (ref 2.9–4.4)
Alpha 1: 0.2 g/dL (ref 0.0–0.4)
Alpha 2: 0.7 g/dL (ref 0.4–1.0)
Beta: 1 g/dL (ref 0.7–1.3)
Gamma Globulin: 1.4 g/dL (ref 0.4–1.8)
Globulin, Total: 3.4 g/dL (ref 2.2–3.9)
Total Protein: 7.3 g/dL (ref 6.0–8.5)

## 2022-11-20 LAB — HEMOGLOBIN A1C
Est. average glucose Bld gHb Est-mCnc: 134 mg/dL
Hgb A1c MFr Bld: 6.3 % — ABNORMAL HIGH (ref 4.8–5.6)

## 2022-11-20 LAB — BMP8+EGFR
BUN/Creatinine Ratio: 12 (ref 12–28)
BUN: 14 mg/dL (ref 8–27)
CO2: 23 mmol/L (ref 20–29)
Calcium: 9.8 mg/dL (ref 8.7–10.3)
Chloride: 104 mmol/L (ref 96–106)
Creatinine, Ser: 1.15 mg/dL — ABNORMAL HIGH (ref 0.57–1.00)
Glucose: 84 mg/dL (ref 70–99)
Potassium: 4.9 mmol/L (ref 3.5–5.2)
Sodium: 141 mmol/L (ref 134–144)
eGFR: 54 mL/min/{1.73_m2} — ABNORMAL LOW (ref >59)

## 2022-11-27 DIAGNOSIS — E6609 Other obesity due to excess calories: Secondary | ICD-10-CM | POA: Insufficient documentation

## 2022-11-27 DIAGNOSIS — Z1231 Encounter for screening mammogram for malignant neoplasm of breast: Secondary | ICD-10-CM | POA: Insufficient documentation

## 2022-11-27 DIAGNOSIS — E66811 Obesity, class 1: Secondary | ICD-10-CM | POA: Insufficient documentation

## 2022-11-27 NOTE — Assessment & Plan Note (Signed)
Chronic, diastolic elevation noted. She is encouraged to decrease her sodium intake and her intake of packaged/processed foods. For now, she will continue with lisinopril 20mg  daily and amlodipine 2.5mg  nightly. May need to increase amlodipine to 5mg  nightly.

## 2022-11-27 NOTE — Assessment & Plan Note (Addendum)
Chronic, I will send rx Mounjaro 7.5mg  weekly. She is encouraged to incorporate more exercise into her daily routine. She will rto in 3-4 months for re-evaluation. Chronic, she is encouraged to stay well hydrated, avoid NSAIDs and keep BP controlled to prevent progression of CKD.

## 2022-11-27 NOTE — Assessment & Plan Note (Signed)
She is encouraged to schedule mammogram in the near future.

## 2022-11-27 NOTE — Assessment & Plan Note (Signed)
She is encouraged to strive for BMI less than 30 to decrease cardiac risk. Advised to aim for at least 150 minutes of exercise per week.  

## 2022-11-28 ENCOUNTER — Other Ambulatory Visit (HOSPITAL_COMMUNITY): Payer: Self-pay

## 2022-11-28 ENCOUNTER — Other Ambulatory Visit: Payer: Self-pay

## 2022-11-28 ENCOUNTER — Other Ambulatory Visit (HOSPITAL_BASED_OUTPATIENT_CLINIC_OR_DEPARTMENT_OTHER): Payer: Self-pay

## 2022-11-28 MED ORDER — ATORVASTATIN CALCIUM 10 MG PO TABS
10.0000 mg | ORAL_TABLET | Freq: Every day | ORAL | 3 refills | Status: DC
  Filled 2022-11-28: qty 90, 90d supply, fill #0
  Filled 2022-12-28 – 2023-02-22 (×2): qty 90, 90d supply, fill #1
  Filled 2023-05-23 – 2023-06-05 (×2): qty 90, 90d supply, fill #2

## 2022-11-28 MED ORDER — AMLODIPINE BESYLATE 5 MG PO TABS
2.5000 mg | ORAL_TABLET | Freq: Every day | ORAL | 1 refills | Status: DC
  Filled 2022-11-28: qty 45, 90d supply, fill #0
  Filled 2022-12-28 – 2023-02-22 (×2): qty 45, 90d supply, fill #1
  Filled 2023-05-23 – 2023-06-05 (×2): qty 45, 90d supply, fill #2

## 2022-11-28 MED ORDER — LISINOPRIL 20 MG PO TABS
20.0000 mg | ORAL_TABLET | Freq: Every day | ORAL | 1 refills | Status: DC
  Filled 2022-11-28: qty 90, 90d supply, fill #0
  Filled 2022-12-28 – 2023-02-22 (×2): qty 90, 90d supply, fill #1

## 2022-11-29 ENCOUNTER — Other Ambulatory Visit (HOSPITAL_COMMUNITY): Payer: Self-pay

## 2022-12-01 ENCOUNTER — Other Ambulatory Visit (HOSPITAL_COMMUNITY): Payer: Self-pay

## 2022-12-12 ENCOUNTER — Other Ambulatory Visit: Payer: Self-pay

## 2022-12-12 ENCOUNTER — Other Ambulatory Visit (HOSPITAL_COMMUNITY): Payer: Self-pay

## 2022-12-12 MED ORDER — MOUNJARO 7.5 MG/0.5ML ~~LOC~~ SOAJ
7.5000 mg | SUBCUTANEOUS | 1 refills | Status: DC
  Filled 2022-12-12: qty 2, 28d supply, fill #0
  Filled 2022-12-28 – 2023-01-08 (×3): qty 2, 28d supply, fill #1

## 2022-12-15 ENCOUNTER — Other Ambulatory Visit (HOSPITAL_COMMUNITY): Payer: Self-pay

## 2022-12-28 ENCOUNTER — Other Ambulatory Visit (HOSPITAL_COMMUNITY): Payer: Self-pay

## 2023-01-05 ENCOUNTER — Other Ambulatory Visit (HOSPITAL_COMMUNITY): Payer: Self-pay

## 2023-01-08 ENCOUNTER — Other Ambulatory Visit (HOSPITAL_COMMUNITY): Payer: Self-pay

## 2023-01-12 DIAGNOSIS — N1831 Chronic kidney disease, stage 3a: Secondary | ICD-10-CM | POA: Diagnosis not present

## 2023-01-12 DIAGNOSIS — E1122 Type 2 diabetes mellitus with diabetic chronic kidney disease: Secondary | ICD-10-CM | POA: Diagnosis not present

## 2023-01-12 DIAGNOSIS — I129 Hypertensive chronic kidney disease with stage 1 through stage 4 chronic kidney disease, or unspecified chronic kidney disease: Secondary | ICD-10-CM | POA: Diagnosis not present

## 2023-01-12 DIAGNOSIS — Z87448 Personal history of other diseases of urinary system: Secondary | ICD-10-CM | POA: Diagnosis not present

## 2023-01-12 DIAGNOSIS — N200 Calculus of kidney: Secondary | ICD-10-CM | POA: Diagnosis not present

## 2023-01-12 DIAGNOSIS — Z23 Encounter for immunization: Secondary | ICD-10-CM | POA: Diagnosis not present

## 2023-01-12 DIAGNOSIS — E119 Type 2 diabetes mellitus without complications: Secondary | ICD-10-CM | POA: Diagnosis not present

## 2023-01-13 LAB — LAB REPORT - SCANNED
Albumin, Urine POC: 12.8
Creatinine, POC: 217.3 mg/dL
Microalb Creat Ratio: 6

## 2023-01-24 ENCOUNTER — Encounter: Payer: Self-pay | Admitting: Internal Medicine

## 2023-02-02 ENCOUNTER — Other Ambulatory Visit: Payer: Self-pay | Admitting: Internal Medicine

## 2023-02-05 ENCOUNTER — Other Ambulatory Visit (HOSPITAL_COMMUNITY): Payer: Self-pay

## 2023-02-05 ENCOUNTER — Other Ambulatory Visit: Payer: Self-pay | Admitting: Internal Medicine

## 2023-02-06 ENCOUNTER — Other Ambulatory Visit (HOSPITAL_COMMUNITY): Payer: Self-pay

## 2023-02-06 MED ORDER — MOUNJARO 7.5 MG/0.5ML ~~LOC~~ SOAJ
7.5000 mg | SUBCUTANEOUS | 1 refills | Status: DC
  Filled 2023-02-06: qty 2, 28d supply, fill #0
  Filled 2023-03-02: qty 2, 28d supply, fill #1

## 2023-02-18 ENCOUNTER — Other Ambulatory Visit: Payer: Self-pay

## 2023-02-18 ENCOUNTER — Encounter (HOSPITAL_BASED_OUTPATIENT_CLINIC_OR_DEPARTMENT_OTHER): Payer: Self-pay

## 2023-02-18 ENCOUNTER — Other Ambulatory Visit (HOSPITAL_COMMUNITY): Payer: Self-pay

## 2023-02-18 ENCOUNTER — Emergency Department (HOSPITAL_BASED_OUTPATIENT_CLINIC_OR_DEPARTMENT_OTHER): Payer: 59

## 2023-02-18 ENCOUNTER — Emergency Department (HOSPITAL_BASED_OUTPATIENT_CLINIC_OR_DEPARTMENT_OTHER)
Admission: EM | Admit: 2023-02-18 | Discharge: 2023-02-18 | Disposition: A | Discharge status: Home / Self Care | Payer: 59 | Attending: Emergency Medicine | Admitting: Emergency Medicine

## 2023-02-18 DIAGNOSIS — N134 Hydroureter: Secondary | ICD-10-CM | POA: Diagnosis not present

## 2023-02-18 DIAGNOSIS — N132 Hydronephrosis with renal and ureteral calculous obstruction: Secondary | ICD-10-CM | POA: Diagnosis not present

## 2023-02-18 DIAGNOSIS — I10 Essential (primary) hypertension: Secondary | ICD-10-CM | POA: Diagnosis not present

## 2023-02-18 DIAGNOSIS — Z79899 Other long term (current) drug therapy: Secondary | ICD-10-CM | POA: Diagnosis not present

## 2023-02-18 DIAGNOSIS — E119 Type 2 diabetes mellitus without complications: Secondary | ICD-10-CM | POA: Diagnosis not present

## 2023-02-18 DIAGNOSIS — K5730 Diverticulosis of large intestine without perforation or abscess without bleeding: Secondary | ICD-10-CM | POA: Diagnosis not present

## 2023-02-18 DIAGNOSIS — N200 Calculus of kidney: Secondary | ICD-10-CM | POA: Insufficient documentation

## 2023-02-18 DIAGNOSIS — Z794 Long term (current) use of insulin: Secondary | ICD-10-CM | POA: Diagnosis not present

## 2023-02-18 DIAGNOSIS — R109 Unspecified abdominal pain: Secondary | ICD-10-CM | POA: Diagnosis present

## 2023-02-18 DIAGNOSIS — K429 Umbilical hernia without obstruction or gangrene: Secondary | ICD-10-CM | POA: Diagnosis not present

## 2023-02-18 LAB — URINALYSIS, ROUTINE W REFLEX MICROSCOPIC
Bilirubin Urine: NEGATIVE
Glucose, UA: NEGATIVE mg/dL
Ketones, ur: NEGATIVE mg/dL
Nitrite: NEGATIVE
Protein, ur: 30 mg/dL — AB
RBC / HPF: >50 RBC/hpf (ref 0–5)
Specific Gravity, Urine: 1.026 (ref 1.005–1.030)
WBC, UA: >50 WBC/hpf (ref 0–5)
pH: 6.5 (ref 5.0–8.0)

## 2023-02-18 LAB — CBC WITH DIFFERENTIAL/PLATELET
Abs Immature Granulocytes: 0.03 10*3/uL (ref 0.00–0.07)
Basophils Absolute: 0 10*3/uL (ref 0.0–0.1)
Basophils Relative: 1 %
Eosinophils Absolute: 0.1 10*3/uL (ref 0.0–0.5)
Eosinophils Relative: 1 %
HCT: 40.4 % (ref 36.0–46.0)
Hemoglobin: 14 g/dL (ref 12.0–15.0)
Immature Granulocytes: 0 %
Lymphocytes Relative: 17 %
Lymphs Abs: 1.2 10*3/uL (ref 0.7–4.0)
MCH: 29.3 pg (ref 26.0–34.0)
MCHC: 34.7 g/dL (ref 30.0–36.0)
MCV: 84.5 fL (ref 80.0–100.0)
Monocytes Absolute: 0.3 10*3/uL (ref 0.1–1.0)
Monocytes Relative: 4 %
Neutro Abs: 5.4 10*3/uL (ref 1.7–7.7)
Neutrophils Relative %: 77 %
Platelets: 228 10*3/uL (ref 150–400)
RBC: 4.78 MIL/uL (ref 3.87–5.11)
RDW: 12.5 % (ref 11.5–15.5)
WBC: 7.1 10*3/uL (ref 4.0–10.5)
nRBC: 0 % (ref 0.0–0.2)

## 2023-02-18 LAB — COMPREHENSIVE METABOLIC PANEL
ALT: 10 U/L (ref 0–44)
AST: 11 U/L — ABNORMAL LOW (ref 15–41)
Albumin: 4.3 g/dL (ref 3.5–5.0)
Alkaline Phosphatase: 90 U/L (ref 38–126)
Anion gap: 9 (ref 5–15)
BUN: 15 mg/dL (ref 8–23)
CO2: 22 mmol/L (ref 22–32)
Calcium: 9.2 mg/dL (ref 8.9–10.3)
Chloride: 106 mmol/L (ref 98–111)
Creatinine, Ser: 1.1 mg/dL — ABNORMAL HIGH (ref 0.44–1.00)
GFR, Estimated: 56 mL/min — ABNORMAL LOW (ref >60)
Glucose, Bld: 194 mg/dL — ABNORMAL HIGH (ref 70–99)
Potassium: 3.6 mmol/L (ref 3.5–5.1)
Sodium: 137 mmol/L (ref 135–145)
Total Bilirubin: 0.4 mg/dL (ref <1.2)
Total Protein: 7.6 g/dL (ref 6.5–8.1)

## 2023-02-18 LAB — LIPASE, BLOOD: Lipase: 66 U/L — ABNORMAL HIGH (ref 11–51)

## 2023-02-18 MED ORDER — KETOROLAC TROMETHAMINE 30 MG/ML IJ SOLN
30.0000 mg | Freq: Once | INTRAMUSCULAR | Status: AC
  Administered 2023-02-18: 30 mg via INTRAVENOUS
  Filled 2023-02-18: qty 1

## 2023-02-18 MED ORDER — OXYCODONE HCL 5 MG PO TABS
5.0000 mg | ORAL_TABLET | ORAL | 0 refills | Status: DC | PRN
  Filled 2023-02-18: qty 20, 4d supply, fill #0

## 2023-02-18 MED ORDER — CEPHALEXIN 500 MG PO CAPS
500.0000 mg | ORAL_CAPSULE | Freq: Two times a day (BID) | ORAL | 0 refills | Status: AC
  Filled 2023-02-18 – 2023-02-19 (×2): qty 14, 7d supply, fill #0

## 2023-02-18 MED ORDER — CEPHALEXIN 250 MG PO CAPS
500.0000 mg | ORAL_CAPSULE | Freq: Once | ORAL | Status: AC
  Administered 2023-02-18: 500 mg via ORAL
  Filled 2023-02-18: qty 2

## 2023-02-18 MED ORDER — ONDANSETRON 4 MG PO TBDP
4.0000 mg | ORAL_TABLET | Freq: Three times a day (TID) | ORAL | 0 refills | Status: AC | PRN
  Filled 2023-02-18 – 2023-02-19 (×2): qty 20, 7d supply, fill #0

## 2023-02-18 MED ORDER — TAMSULOSIN HCL 0.4 MG PO CAPS
0.4000 mg | ORAL_CAPSULE | Freq: Every day | ORAL | 0 refills | Status: DC
  Filled 2023-02-18: qty 30, 30d supply, fill #0

## 2023-02-18 MED ORDER — ONDANSETRON HCL 4 MG/2ML IJ SOLN
4.0000 mg | Freq: Once | INTRAMUSCULAR | Status: AC
  Administered 2023-02-18: 4 mg via INTRAVENOUS
  Filled 2023-02-18: qty 2

## 2023-02-18 MED ORDER — TAMSULOSIN HCL 0.4 MG PO CAPS
0.4000 mg | ORAL_CAPSULE | Freq: Once | ORAL | Status: AC
Start: 2023-02-18 — End: 2023-02-18
  Administered 2023-02-18: 0.4 mg via ORAL
  Filled 2023-02-18: qty 1

## 2023-02-18 NOTE — ED Provider Notes (Signed)
[place] EMERGENCY DEPARTMENT AT [facility] Provider Note   CSN: [id number] Arrival date & time: 02/18/23  4098     History  Chief Complaint  Patient presents with   Flank Pain    Donna Duarte is a 62 y.o. female.  HPI    62 year old female with a history of diabetes, hypertension, hyperlipidemia, thyroid disease who presents with concern for right sided flank pain.  Presents with concern for right flank pain.  Reports the pain feels like her prior kidney stones, with right-sided flank pain radiating towards her abdomen.  Woke up with the pain early this morning and it was severe.  She is received Toradol at time of evaluation and feels significantly improved.  She reports associated nausea and vomiting.  Denies diarrhea, constipation, fever, urinary symptoms.  She reports chronic hematuria for which she sees urology.  Denies cough, shortness of breath or chest pain  Past Medical History:  Diagnosis Date   Allergy    Diabetes mellitus without complication (HCC)    Hyperlipidemia    Hypertension    Thyroid disease      Home Medications Prior to Admission medications   Medication Sig Start Date End Date Taking? Authorizing Provider  cephALEXin (KEFLEX) 500 MG capsule Take 1 capsule (500 mg total) by mouth 2 (two) times daily for 7 days. 02/18/23 02/25/23 Yes Alvira Monday, MD  ondansetron (ZOFRAN-ODT) 4 MG disintegrating tablet Take 1 tablet (4 mg total) by mouth every 8 (eight) hours as needed for nausea or vomiting. 02/18/23  Yes Alvira Monday, MD  oxyCODONE (ROXICODONE) 5 MG immediate release tablet Take 1 tablet (5 mg total) by mouth every 4 (four) hours as needed for severe pain (pain score 7-10). 02/18/23  Yes Alvira Monday, MD  tamsulosin (FLOMAX) 0.4 MG CAPS capsule Take 1 capsule (0.4 mg total) by mouth daily. 02/18/23  Yes Alvira Monday, MD  albuterol (VENTOLIN HFA) 108 (90 Base) MCG/ACT inhaler Inhale into the lungs every 6 (six) hours as  needed for wheezing or shortness of breath. Patient not taking: Reported on 12/20/2021    [provider]  amLODipine (NORVASC) 5 MG tablet Take 0.5 tablets (2.5 mg total) by mouth daily with evening meal 11/28/22 11/28/23  Dorothyann Peng, MD  atorvastatin (LIPITOR) 10 MG tablet Take 1 tablet (10 mg total) by mouth daily. 11/28/22 11/23/23  Dorothyann Peng, MD  glucose blood [facility] VERIO) test strip Check blood sugar twice daily. 06/29/22   Dorothyann Peng, MD  Insulin Pen Needle (PEN NEEDLES) 32G X 4 MM MISC Use as directed with Ozempic pen 07/14/20   Dorothyann Peng, MD  Lancets [facility] DELICA PLUS [place]) MISC Use as directed to check blood sugars twice daily. 06/29/22   Dorothyann Peng, MD  lisinopril (ZESTRIL) 20 MG tablet Take 1 tablet (20 mg total) by mouth daily. 11/28/22   Dorothyann Peng, MD  tirzepatide [facility]) 7.5 MG/0.5ML Pen Inject 7.5 mg into the skin once a week. 02/06/23   Dorothyann Peng, MD      Allergies    Fish oil, Penicillins, and Fentanyl    Review of Systems   Review of Systems  Physical Exam Updated Vital Signs BP 126/72 (BP Location: Right Arm)   Pulse 85   Temp 97.8 F (36.6 C) (Oral)   Resp 16   Ht 5\' 6"  (1.676 m)   Wt 95.3 kg   LMP 12/15/2012   SpO2 99%   BMI 33.89 kg/m  Physical Exam Vitals and nursing note reviewed.  Constitutional:  General: She is not in acute distress.    Appearance: She is well-developed. She is not diaphoretic.  HENT:     Head: Normocephalic and atraumatic.  Eyes:     Conjunctiva/sclera: Conjunctivae normal.  Cardiovascular:     Rate and Rhythm: Normal rate and regular rhythm.     Heart sounds: Normal heart sounds. No murmur heard.    No friction rub. No gallop.  Pulmonary:     Effort: Pulmonary effort is normal. No respiratory distress.     Breath sounds: Normal breath sounds. No wheezing or rales.  Abdominal:     General: There is no distension.     Palpations: Abdomen is soft.     Tenderness: There is no  abdominal tenderness. There is no guarding.  Musculoskeletal:        General: No tenderness.     Cervical back: Normal range of motion.  Skin:    General: Skin is warm and dry.     Findings: No erythema or rash.  Neurological:     Mental Status: She is alert and oriented to person, place, and time.     ED Results / Procedures / Treatments   Labs (all labs ordered are listed, but only abnormal results are displayed) Labs Reviewed  COMPREHENSIVE METABOLIC PANEL - Abnormal; Notable for the following components:      Result Value   Glucose, Bld 194 (*)    Creatinine, Ser 1.10 (*)    AST 11 (*)    GFR, Estimated 56 (*)    All other components within normal limits  LIPASE, BLOOD - Abnormal; Notable for the following components:   Lipase 66 (*)    All other components within normal limits  URINALYSIS, ROUTINE W REFLEX MICROSCOPIC - Abnormal; Notable for the following components:   APPearance HAZY (*)    Hgb urine dipstick LARGE (*)    Protein, ur 30 (*)    Leukocytes,Ua LARGE (*)    Bacteria, UA FEW (*)    All other components within normal limits  CBC WITH DIFFERENTIAL/PLATELET    EKG None  Radiology CT Renal Stone Study  Result Date: 02/18/2023 CLINICAL DATA:  Abdominal/flank pain.  Evaluate for kidney stone. EXAM: CT ABDOMEN AND PELVIS WITHOUT CONTRAST TECHNIQUE: Multidetector CT imaging of the abdomen and pelvis was performed following the standard protocol without IV contrast. RADIATION DOSE REDUCTION: This exam was performed according to the departmental dose-optimization program which includes automated exposure control, adjustment of the mA and/or kV according to patient size and/or use of iterative reconstruction technique. COMPARISON:  CT AP from 02/28/2012 and CT angio chest from 08/06/2021. FINDINGS: Lower chest: There are multiple small scattered lung nodules identified throughout both lower lobes which are new compared with the previous examination. These nodules all  measure less than 5 mm. Index nodule in the right middle lobe measures 4 mm, image 1/4. Index nodule in the periphery of the left lower lobe measures 3 mm. No pleural fluid or airspace consolidation. Hepatobiliary: No focal liver abnormality is seen. Status post cholecystectomy. No biliary dilatation. Pancreas: Unremarkable. No pancreatic ductal dilatation or surrounding inflammatory changes. Spleen: Normal in size without focal abnormality. Adrenals/Urinary Tract: Normal adrenal glands. 4 mm left kidney stone is identified, image 33/2. Punctate stone noted within the interpolar right kidney, image 35/2. No mass identified. Mild right hydronephrosis and hydroureter. Distal right ureteral calculus measures 4 mm, image 69/2. Bladder appears normal. Stomach/Bowel: Stomach is within normal limits. Appendix appears normal. No evidence of bowel  wall thickening, distention, or inflammatory changes. Sigmoid diverticulosis without signs of acute diverticulitis. Vascular/Lymphatic: Aortic atherosclerosis. No aneurysm. No signs of abdominopelvic adenopathy. Reproductive: Calcification along the uterine fundus identified. Uterus appears prominent for patient's age measuring 10.0 x 7.2 by 7.6 cm (volume = 290 cm^3). This is suboptimally evaluated reflecting lack of IV contrast material. No adnexal mass identified. Other: No free fluid or fluid collections. Small fat containing umbilical hernia. Musculoskeletal: No acute or significant osseous findings. IMPRESSION: 1. Mild right hydronephrosis and hydroureter secondary to 4 mm distal right ureteral calculus. 2. Bilateral nephrolithiasis. 3. Sigmoid diverticulosis without signs of acute diverticulitis. 4. Multiple pulmonary nodules. Most significant: Less than 6 mm solid pulmonary nodule. Per Fleischner Society Guidelines, no routine follow-up imaging is recommended. These guidelines do not apply to immunocompromised patients and patients with cancer. Follow up in patients with  significant comorbidities as clinically warranted. For lung cancer screening, adhere to Lung-RADS guidelines. Reference: Radiology. 2017; 284(1):228-43. 5. Uterus appears unusually large for a postmenopausal female which may reflect underlying fibroids, adenomyosis or other underlying mass. This could also be seen with estrogen therapy. Recommend further evaluation with pelvic sonogram. 6.  Aortic Atherosclerosis (ICD10-I70.0). Electronically Signed   By: Signa Kell M.D.   On: 02/18/2023 08:18    Procedures Procedures    Medications Ordered in ED Medications  cephALEXin (KEFLEX) capsule 500 mg (has no administration in time range)  tamsulosin (FLOMAX) capsule 0.4 mg (has no administration in time range)  ketorolac (TORADOL) 30 MG/ML injection 30 mg (30 mg Intravenous Given 02/18/23 0623)  ondansetron (ZOFRAN) injection 4 mg (4 mg Intravenous Given 02/18/23 6010)    ED Course/ Medical Decision Making/ A&P                                   62 year old female with a history of diabetes, hypertension, hyperlipidemia, thyroid disease who presents with concern for right sided flank pain.  DDx includes appendicitis, pancreatitis, choledocholithiasis, pyelonephritis, nephrolithiasis, diverticulitis, aortic dissection, SBO.  Labs completed and personally about interpreted by me show no anemia, leukocytosis, no change in renal function, no clinically significant electrolyte abnormalities.  Lipase is mildly elevated but not consistent with pancreatitis, no signs of transaminitis and have low suspicion for choledocholithiasis by history and exam.  CT renal stone study was completed and personally about interpreted by me shows right sided hydronephrosis, 4mm distal ureteral calculus and other incidental findings.  Recommend Urology follow up for nephrolithiasis. Given flomax, zofran, recommend tylenol and given oxycodone after review in [place] drug database. UA shows WBC and RBC.  No fever, no  tachycardia, no leukocytosis, discussed with urology and feel she stable for outpatient follow-up.  Will give prescription for Keflex.  Recommend GYN follow up for uterine enlargement on CT. Patient discharged in stable condition with understanding of reasons to return.           Final Clinical Impression(s) / ED Diagnoses Final diagnoses:  Nephrolithiasis    Rx / DC Orders ED Discharge Orders          Ordered    oxyCODONE (ROXICODONE) 5 MG immediate release tablet  Every 4 hours PRN        02/18/23 1052    cephALEXin (KEFLEX) 500 MG capsule  2 times daily        02/18/23 1052    tamsulosin (FLOMAX) 0.4 MG CAPS capsule  Daily  02/18/23 1052    ondansetron (ZOFRAN-ODT) 4 MG disintegrating tablet  Every 8 hours PRN        02/18/23 1052              Alvira Monday, MD 02/18/23 1052

## 2023-02-18 NOTE — ED Notes (Signed)
Discharge paperwork given and verbally understood. 

## 2023-02-18 NOTE — ED Notes (Signed)
Pt aware of the need for a urine... Unable to currently provide the sample... 

## 2023-02-18 NOTE — ED Triage Notes (Signed)
Pt to triage c/o right sided flank pain 10/10 ache in nature x 2 hours. Pt has Hx of kidney stones and states this feels same. Pt a/o x 4 VSS NAD Pt room air.

## 2023-02-19 ENCOUNTER — Encounter: Payer: Self-pay | Admitting: Internal Medicine

## 2023-02-19 ENCOUNTER — Other Ambulatory Visit (HOSPITAL_COMMUNITY): Payer: Self-pay

## 2023-02-19 ENCOUNTER — Telehealth: Payer: Self-pay

## 2023-02-19 ENCOUNTER — Telehealth (INDEPENDENT_AMBULATORY_CARE_PROVIDER_SITE_OTHER): Payer: 59 | Admitting: Internal Medicine

## 2023-02-19 DIAGNOSIS — E6609 Other obesity due to excess calories: Secondary | ICD-10-CM

## 2023-02-19 DIAGNOSIS — Z6834 Body mass index (BMI) 34.0-34.9, adult: Secondary | ICD-10-CM | POA: Diagnosis not present

## 2023-02-19 DIAGNOSIS — N852 Hypertrophy of uterus: Secondary | ICD-10-CM | POA: Diagnosis not present

## 2023-02-19 DIAGNOSIS — Z862 Personal history of diseases of the blood and blood-forming organs and certain disorders involving the immune mechanism: Secondary | ICD-10-CM | POA: Diagnosis not present

## 2023-02-19 DIAGNOSIS — E66811 Obesity, class 1: Secondary | ICD-10-CM

## 2023-02-19 DIAGNOSIS — I129 Hypertensive chronic kidney disease with stage 1 through stage 4 chronic kidney disease, or unspecified chronic kidney disease: Secondary | ICD-10-CM | POA: Diagnosis not present

## 2023-02-19 DIAGNOSIS — N1831 Chronic kidney disease, stage 3a: Secondary | ICD-10-CM

## 2023-02-19 DIAGNOSIS — E1122 Type 2 diabetes mellitus with diabetic chronic kidney disease: Secondary | ICD-10-CM | POA: Diagnosis not present

## 2023-02-19 DIAGNOSIS — E041 Nontoxic single thyroid nodule: Secondary | ICD-10-CM | POA: Diagnosis not present

## 2023-02-19 DIAGNOSIS — N200 Calculus of kidney: Secondary | ICD-10-CM | POA: Insufficient documentation

## 2023-02-19 NOTE — Patient Instructions (Signed)
Diabetic Nephropathy  Diabetic nephropathy is kidney disease that is caused by diabetes (diabetes mellitus). Kidneys are organs that filter and clean blood and get rid of body waste products and extra fluid. Diabetes can cause gradual kidney damage over many years. Diabetic nephropathy that keeps getting worse can lead to kidney failure. What are the causes? This condition is caused by diabetes that is not well controlled with treatment. Having high blood sugar (glucose) for a long time because of diabetes can damage blood vessels in the kidneys and cause them to thicken and become scarred. Those changes prevent the kidneys from working like they should. What increases the risk? This condition is more likely to develop in people with diabetes who: Have had diabetes for many years. Have high blood pressure. Have high blood glucose levels over a long period of time. Have a family history of kidney disease. Have a history of tobacco use. Have certain genes that are passed from parent to child (inherited). What are the signs or symptoms? This condition may not cause symptoms at first. If you do have symptoms, they may include: Swelling of your hands, feet, or ankles. Weakness. Poor appetite. Nausea. Confusion. Tiredness (fatigue). Trouble sleeping. Dry, itchy skin. If your condition leads to kidney failure, symptoms may include: Vomiting. Shortness of breath. Jerky movements that you cannot control (seizure). Coma. How is this diagnosed? It is important to diagnose this condition before symptoms develop. You may be screened for this condition at a routine health care visit. Screening tests may include: Urine tests. These may be done every year. Urine collection over a 24-hour period to measure kidney function. Blood tests to measure blood glucose levels and kidney function. Regular blood pressure monitoring. If your health care provider suspects diabetic nephropathy, they may: Review  your medical history and symptoms. Do a physical exam. Do an ultrasound of your kidneys. Do a procedure to take a sample of kidney tissue for testing (biopsy). How is this treated? The goal of treatment is to prevent or slow down any damage to your kidneys by managing your diabetes. Treatment may include: Controlling your blood pressure. Your target blood pressure may vary. It may depend on your medical conditions, your age, and other factors. To help control blood pressure, you may be prescribed medicines to lower your blood pressure (ACE inhibitors) or to help your body get rid of excess fluid (diuretics). Controlling your A1c level, also called hemoglobin A1c level.Generally, the goal of treatment is to maintain an A1c level of less than 7%. Controlling your blood glucose. Controlling your blood lipids. If you have high cholesterol, you may need to take lipid-lowering drugs, such as statins. Other treatments may include: Medicines, including insulin injections. Lifestyle changes, such as losing weight, quitting smoking, or making changes to your diet. If your disease progresses to end-stage kidney failure, treatment may include: Dialysis. This is a procedure to filter your blood with a machine. Kidney transplant. Follow these instructions at home: Eating and drinking Eat healthy foods, and eat healthy snacks between meals. Follow instructions from your health care provider about what you may eat and drink. Limit your salt (sodium), protein, or fluid intake as directed. If you drink alcohol: Limit how much you have to: 0-1 drink a day for women who are not pregnant. 0-2 drinks a day for men. Know how much alcohol is in your drink. In the [place] one drink equals one 12 oz bottle of beer (355 mL), one 5 oz glass of wine (148 mL),  or one 1 oz glass of hard liquor (44 mL). Lifestyle Do not use any products that contain nicotine or tobacco. These products include cigarettes, chewing tobacco,  and vaping devices, such as e-cigarettes. If you need help quitting, ask your health care provider. Maintain a healthy weight. Work with your health care provider to lose weight, if needed. Be physically active every day. Ask your health care provider what type of exercise is best for you. Work with your health care provider to manage your blood pressure. General instructions     Follow your diabetes management plan as directed. Check your blood glucose levels as told by your health care provider. Keep your blood glucose in your target range as told by your health care provider. Have your A1c level checked two or more times a year, or as often as told by your health care provider. Measure your blood pressure regularly at home, as told by your health care provider. Take over-the-counter and prescription medicines only as told by your health care provider. These include insulin and supplements. Keep all follow-up and routine visits. Make sure you get screening tests as directed. Where to find more information American Diabetes Association: diabetes.org Contact a health care provider if: You have trouble keeping your blood glucose in your goal range. Your blood glucose level is higher than 240 mg/dL (75.6 mmol/L) for 2 days in a row. You have swelling in your hands, ankles, or feet. You feel weak, tired, or dizzy. You have sudden muscle tightening (spasms). You have nausea or vomiting. You feel tired all the time. Get help right away if: You have difficulty staying awake. You faint. You have: A seizure. Severe, painful muscle spasms. Shortness of breath. Chest pain. These symptoms may be an emergency. Get help right away. Call 911. Do not wait to see if the symptoms will go away. Do not drive yourself to the hospital. This information is not intended to replace advice given to you by your health care provider. Make sure you discuss any questions you have with your health care  provider. Document Revised: 07/14/2021 Document Reviewed: 07/14/2021 Elsevier Patient Education  2024 [employer].

## 2023-02-19 NOTE — Transitions of Care (Post Inpatient/ED Visit) (Signed)
   02/19/2023  Name: Donna Duarte MRN: [id number] DOB: 07-31-1960  Today's TOC FU Call Status:   Patient's Name and Date of Birth confirmed.  Transition Care Management Follow-up Telephone Call Date of Discharge: 02/18/23 Discharge Facility: [facility] ([facility]) Type of Discharge: Emergency Department Reason for ED Visit: Other: How have you been since you were released from the hospital?: Better Any questions or concerns?: No  Items Reviewed: Did you receive and understand the discharge instructions provided?: Yes Medications obtained,verified, and reconciled?: Yes (Medications Reviewed) Any new allergies since your discharge?: No Dietary orders reviewed?: NA Do you have support at home?: No  Medications Reviewed Today: Medications Reviewed Today   Medications were not reviewed in this encounter     Home Care and Equipment/Supplies: Were Home Health Services Ordered?: No Any new equipment or medical supplies ordered?: No  Functional Questionnaire: Do you need assistance with bathing/showering or dressing?: No Do you need assistance with meal preparation?: No Do you need assistance with eating?: No Do you have difficulty maintaining continence: No Do you need assistance with getting out of bed/getting out of a chair/moving?: No Do you have difficulty managing or taking your medications?: No  Follow up appointments reviewed: PCP Follow-up appointment confirmed?: Yes Date of PCP follow-up appointment?: 02/19/23 Follow-up Provider: Dorothyann Peng Specialist [facility] Follow-up appointment confirmed?: NA Do you need transportation to your follow-up appointment?: No Do you understand care options if your condition(s) worsen?: Yes-patient verbalized understanding    SIGNATURE Randa Lynn, CMA

## 2023-02-19 NOTE — Progress Notes (Signed)
Virtual Visit via Video Note  I,Donna Duarte, CMA,acting as a Neurosurgeon for Donna Aliment, MD.,have documented all relevant documentation on the behalf of Donna Aliment, MD,as directed by  Donna Aliment, MD while in the presence of Donna Aliment, MD.  I connected with [name] on 02/21/23 at  4:20 PM EST by a video enabled telemedicine application and verified that I am speaking with the correct person using two identifiers.  Patient Location: Home Provider Location: Office/Clinic  I discussed the limitations, risks, security, and privacy concerns of performing an evaluation and management service by video and the availability of in person appointments. I also discussed with the patient that there may be a patient responsible charge related to this service. The patient expressed understanding and agreed to proceed.  Subjective: PCP: Dorothyann Peng, MD  Chief Complaint  Patient presents with   Follow-up   Patient presents virtually today for ER follow up. This visit type was used per the patient's request.  She presents for ER f/u, she is also due for diabetes check. She presented to [facility] on 02/18/2023 for further evaluation of flank pain. CT renal pos for nephrolithiasis. She was given Flomax, Keflex, Tylenol and Oxycodone and advised to f/u with Urology. She admits she is now feeling better. She denies having any questions or concerns.    Diabetes She presents for her follow-up diabetic visit. She has type 2 diabetes mellitus. Her disease course has been stable. Pertinent negatives for diabetes include no blurred vision, no polydipsia, no polyphagia and no polyuria. There are no hypoglycemic complications. Risk factors for coronary artery disease include diabetes mellitus, dyslipidemia, hypertension, post-menopausal and sedentary lifestyle. An ACE inhibitor/angiotensin II receptor blocker is being taken.  Hypertension This is a chronic problem. The current  episode started more than 1 year ago. The problem has been gradually improving since onset. The problem is controlled. Pertinent negatives include no blurred vision. Risk factors for coronary artery disease include dyslipidemia and obesity. Past treatments include ACE inhibitors. The current treatment provides moderate improvement.     ROS: Review of Systems  Constitutional: Negative.   Eyes:  Negative for blurred vision.  Respiratory: Negative.    Cardiovascular: Negative.   Gastrointestinal: Negative.   Genitourinary:  Positive for flank pain.  Neurological: Negative.   Endo/Heme/Allergies:  Negative for polydipsia and polyphagia.  Psychiatric/Behavioral: Negative.       Current Outpatient Medications:    albuterol (VENTOLIN HFA) 108 (90 Base) MCG/ACT inhaler, Inhale into the lungs every 6 (six) hours as needed for wheezing or shortness of breath. (Patient not taking: Reported on 12/20/2021), Disp: , Rfl:    amLODipine (NORVASC) 5 MG tablet, Take 0.5 tablets (2.5 mg total) by mouth daily with evening meal, Disp: 90 tablet, Rfl: 1   atorvastatin (LIPITOR) 10 MG tablet, Take 1 tablet (10 mg total) by mouth daily., Disp: 90 tablet, Rfl: 3   cephALEXin (KEFLEX) 500 MG capsule, Take 1 capsule (500 mg total) by mouth 2 (two) times daily for 7 days., Disp: 14 capsule, Rfl: 0   glucose blood (ONETOUCH VERIO) test strip, Check blood sugar twice daily., Disp: 100 each, Rfl: 12   Insulin Pen Needle (PEN NEEDLES) 32G X 4 MM MISC, Use as directed with Ozempic pen, Disp: 100 each, Rfl: 1   Lancets (ONETOUCH DELICA PLUS LANCET30G) MISC, Use as directed to check blood sugars twice daily., Disp: 100 each, Rfl: 2   lisinopril (ZESTRIL) 20 MG tablet, Take 1 tablet (  20 mg total) by mouth daily., Disp: 90 tablet, Rfl: 1   ondansetron (ZOFRAN-ODT) 4 MG disintegrating tablet, Dissolve 1 tablet (4 mg total) by mouth every 8 (eight) hours as needed for nausea or vomiting., Disp: 20 tablet, Rfl: 0   oxyCODONE  (ROXICODONE) 5 MG immediate release tablet, Take 1 tablet (5 mg total) by mouth every 4 (four) hours as needed for severe pain (pain score 7-10)., Disp: 20 tablet, Rfl: 0   tamsulosin (FLOMAX) 0.4 MG CAPS capsule, Take 1 capsule (0.4 mg total) by mouth daily., Disp: 30 capsule, Rfl: 0   tirzepatide (MOUNJARO) 7.5 MG/0.5ML Pen, Inject 7.5 mg into the skin once a week., Disp: 2 mL, Rfl: 1  Observations/Objective: There were no vitals filed for this visit. Physical Exam Vitals and nursing note reviewed.  Constitutional:      Appearance: Normal appearance.  HENT:     Head: Normocephalic and atraumatic.  Eyes:     Extraocular Movements: Extraocular movements intact.  Cardiovascular:     Rate and Rhythm: Normal rate and regular rhythm.     Heart sounds: Normal heart sounds.  Pulmonary:     Effort: Pulmonary effort is normal.     Breath sounds: Normal breath sounds.  Musculoskeletal:     Cervical back: Normal range of motion.  Skin:    General: Skin is warm.  Neurological:     General: No focal deficit present.     Mental Status: She is alert.  Psychiatric:        Mood and Affect: Mood normal.        Behavior: Behavior normal.     Assessment and Plan: Nephrolithiasis Assessment & Plan: Pt reports history of nephrolithiasis. She was previously followed by Urology. She "stopped going because he was not doing anything for me". Advised to complete full course of Keflex and to add lemon juice to her water. Importance of staying well hydrated was discussed with the patient. I do suggest that she f/u with Urology for further management. Also advised to stop drinking sweet tea.    Type 2 diabetes mellitus with stage 3a chronic kidney disease, without long-term current use of insulin (HCC) Assessment & Plan: Chronic, she agrees to come to office next week for labwork. I will adjust meds as needed. Importance of medication and dietary compliance was discussed with the patient.   Orders: -      CMP14+EGFR; Future -     Hemoglobin A1c; Future  Parenchymal renal hypertension, stage 1 through stage 4 or unspecified chronic kidney disease Assessment & Plan: Chronic.  She is encouraged to decrease her sodium intake and her intake of packaged/processed foods. For now, she will continue with lisinopril 20mg  daily and amlodipine 2.5mg  nightly.    Uterine enlargement Assessment & Plan: This was seen on her CT renal study. She agrees to GYN referral and u/s for further evaluation.   Orders: -     Ambulatory referral to Gynecology -     US PELVIC COMPLETE WITH TRANSVAGINAL; Future  Thyroid nodule Assessment & Plan: She has h/o multinodular goiter. Last u/s was performed in 2015. Will schedule u/s to document no changes.   Orders: -     TSH + free T4; Future -     US THYROID; Future  Class 1 obesity due to excess calories with serious comorbidity and body mass index (BMI) of 34.0 to 34.9 in adult Assessment & Plan: She is encouraged to strive for BMI less than 30 to decrease cardiac  risk. Advised to aim for at least 150 minutes of exercise per week.    History of sarcoidosis -     Angiotensin converting enzyme; Future   TIME SPENT IN VISIT: 28 MINUTES  Follow Up Instructions: Return if symptoms worsen or fail to improve.   I discussed the assessment and treatment plan with the patient. The patient was provided an opportunity to ask questions, and all were answered. The patient agreed with the plan and demonstrated an understanding of the instructions.   The patient was advised to call back or seek an in-person evaluation if the symptoms worsen or if the condition fails to improve as anticipated.  The above assessment and management plan was discussed with the patient. The patient verbalized understanding of and has agreed to the management plan.   I, Donna Aliment, MD, have reviewed all documentation for this visit. The documentation on 02/21/23 for the exam,  diagnosis, procedures, and orders are all accurate and complete.

## 2023-02-21 ENCOUNTER — Other Ambulatory Visit: Payer: Self-pay

## 2023-02-21 ENCOUNTER — Emergency Department (HOSPITAL_BASED_OUTPATIENT_CLINIC_OR_DEPARTMENT_OTHER)
Admission: EM | Admit: 2023-02-21 | Discharge: 2023-02-21 | Disposition: A | Discharge status: Home / Self Care | Payer: 59 | Attending: Emergency Medicine | Admitting: Emergency Medicine

## 2023-02-21 ENCOUNTER — Encounter (HOSPITAL_BASED_OUTPATIENT_CLINIC_OR_DEPARTMENT_OTHER): Payer: Self-pay | Admitting: Emergency Medicine

## 2023-02-21 DIAGNOSIS — N23 Unspecified renal colic: Secondary | ICD-10-CM | POA: Insufficient documentation

## 2023-02-21 DIAGNOSIS — I10 Essential (primary) hypertension: Secondary | ICD-10-CM | POA: Diagnosis not present

## 2023-02-21 DIAGNOSIS — R1032 Left lower quadrant pain: Secondary | ICD-10-CM | POA: Diagnosis not present

## 2023-02-21 DIAGNOSIS — E041 Nontoxic single thyroid nodule: Secondary | ICD-10-CM | POA: Insufficient documentation

## 2023-02-21 DIAGNOSIS — Z794 Long term (current) use of insulin: Secondary | ICD-10-CM | POA: Diagnosis not present

## 2023-02-21 DIAGNOSIS — R109 Unspecified abdominal pain: Secondary | ICD-10-CM | POA: Diagnosis present

## 2023-02-21 DIAGNOSIS — E119 Type 2 diabetes mellitus without complications: Secondary | ICD-10-CM | POA: Insufficient documentation

## 2023-02-21 DIAGNOSIS — Z79899 Other long term (current) drug therapy: Secondary | ICD-10-CM | POA: Insufficient documentation

## 2023-02-21 DIAGNOSIS — Z87442 Personal history of urinary calculi: Secondary | ICD-10-CM | POA: Diagnosis not present

## 2023-02-21 DIAGNOSIS — N200 Calculus of kidney: Secondary | ICD-10-CM | POA: Diagnosis not present

## 2023-02-21 DIAGNOSIS — N852 Hypertrophy of uterus: Secondary | ICD-10-CM | POA: Insufficient documentation

## 2023-02-21 HISTORY — DX: Calculus of kidney: N20.0

## 2023-02-21 MED ORDER — MORPHINE SULFATE (PF) 4 MG/ML IV SOLN
4.0000 mg | Freq: Once | INTRAVENOUS | Status: AC
  Administered 2023-02-21: 4 mg via INTRAVENOUS
  Filled 2023-02-21: qty 1

## 2023-02-21 MED ORDER — KETOROLAC TROMETHAMINE 30 MG/ML IJ SOLN
30.0000 mg | Freq: Once | INTRAMUSCULAR | Status: AC
  Administered 2023-02-21: 30 mg via INTRAVENOUS
  Filled 2023-02-21: qty 1

## 2023-02-21 MED ORDER — ONDANSETRON HCL 4 MG/2ML IJ SOLN
4.0000 mg | Freq: Once | INTRAMUSCULAR | Status: AC
  Administered 2023-02-21: 4 mg via INTRAVENOUS
  Filled 2023-02-21: qty 2

## 2023-02-21 NOTE — ED Triage Notes (Signed)
C/o right flank pain beginning 7 hours PTA.also, nausea and vomiting beginning at same time

## 2023-02-21 NOTE — Assessment & Plan Note (Signed)
She is encouraged to strive for BMI less than 30 to decrease cardiac risk. Advised to aim for at least 150 minutes of exercise per week.

## 2023-02-21 NOTE — ED Notes (Signed)
ED Provider at bedside. 

## 2023-02-21 NOTE — Discharge Instructions (Signed)
Continue medications as previously prescribed.  Follow-up with [facility] if your symptoms persist through the weekend.  Their contact information has been provided in this discharge summary for you to call and make these arrangements.

## 2023-02-21 NOTE — ED Provider Notes (Signed)
[place] EMERGENCY DEPARTMENT AT [facility] Provider Note   CSN: [id number] Arrival date & time: 02/21/23  0119     History  Chief Complaint  Patient presents with   Flank Pain    Donna Duarte is a 62 y.o. female.  Patient is a 62 year old female with history of type 2 diabetes, kidney stones, hypertension.  Patient presenting today for evaluation of right flank pain.  She was seen here 2 days ago and diagnosed with a 4 mm stone in the distal right ureter.  She was sent home with pain medication and nausea medicine, but pain became worse tonight and was unrelieved with these medications.  No fevers or chills.  She does feel nauseated and reports an episode of vomiting this evening.  The history is provided by the patient.       Home Medications Prior to Admission medications   Medication Sig Start Date End Date Taking? Authorizing Provider  albuterol (VENTOLIN HFA) 108 (90 Base) MCG/ACT inhaler Inhale into the lungs every 6 (six) hours as needed for wheezing or shortness of breath. Patient not taking: Reported on 12/20/2021    [provider]  amLODipine (NORVASC) 5 MG tablet Take 0.5 tablets (2.5 mg total) by mouth daily with evening meal 11/28/22 11/28/23  Dorothyann Peng, MD  atorvastatin (LIPITOR) 10 MG tablet Take 1 tablet (10 mg total) by mouth daily. 11/28/22 11/23/23  Dorothyann Peng, MD  cephALEXin (KEFLEX) 500 MG capsule Take 1 capsule (500 mg total) by mouth 2 (two) times daily for 7 days. 02/18/23 02/26/23  Alvira Monday, MD  glucose blood (ONETOUCH VERIO) test strip Check blood sugar twice daily. 06/29/22   Dorothyann Peng, MD  Insulin Pen Needle (PEN NEEDLES) 32G X 4 MM MISC Use as directed with Ozempic pen 07/14/20   Dorothyann Peng, MD  Lancets [facility] DELICA PLUS [place]) MISC Use as directed to check blood sugars twice daily. 06/29/22   Dorothyann Peng, MD  lisinopril (ZESTRIL) 20 MG tablet Take 1 tablet (20 mg total) by mouth daily. 11/28/22   Dorothyann Peng, MD  ondansetron (ZOFRAN-ODT) 4 MG disintegrating tablet Dissolve 1 tablet (4 mg total) by mouth every 8 (eight) hours as needed for nausea or vomiting. 02/18/23   Alvira Monday, MD  oxyCODONE (ROXICODONE) 5 MG immediate release tablet Take 1 tablet (5 mg total) by mouth every 4 (four) hours as needed for severe pain (pain score 7-10). 02/18/23   Alvira Monday, MD  tamsulosin (FLOMAX) 0.4 MG CAPS capsule Take 1 capsule (0.4 mg total) by mouth daily. 02/18/23   Alvira Monday, MD  tirzepatide [facility]) 7.5 MG/0.5ML Pen Inject 7.5 mg into the skin once a week. 02/06/23   Dorothyann Peng, MD      Allergies    Fish oil, Penicillins, and Fentanyl    Review of Systems   Review of Systems  All other systems reviewed and are negative.   Physical Exam Updated Vital Signs BP (!) 150/96 (BP Location: Right Arm)   Pulse 98   Temp 98.1 F (36.7 C) (Oral)   LMP 12/15/2012   SpO2 98%  Physical Exam Vitals and nursing note reviewed.  Constitutional:      General: She is not in acute distress.    Appearance: She is well-developed. She is not diaphoretic.  HENT:     Head: Normocephalic and atraumatic.  Cardiovascular:     Rate and Rhythm: Normal rate and regular rhythm.     Heart sounds: No murmur heard.  No friction rub. No gallop.  Pulmonary:     Effort: Pulmonary effort is normal. No respiratory distress.     Breath sounds: Normal breath sounds. No wheezing.  Abdominal:     General: Bowel sounds are normal. There is no distension.     Palpations: Abdomen is soft.     Tenderness: There is no abdominal tenderness. There is right CVA tenderness. There is no left CVA tenderness, guarding or rebound.  Musculoskeletal:        General: Normal range of motion.     Cervical back: Normal range of motion and neck supple.  Skin:    General: Skin is warm and dry.  Neurological:     General: No focal deficit present.     Mental Status: She is alert and oriented to person, place,  and time.     ED Results / Procedures / Treatments   Labs (all labs ordered are listed, but only abnormal results are displayed) Labs Reviewed - No data to display  EKG None  Radiology No results found.  Procedures Procedures    Medications Ordered in ED Medications  ketorolac (TORADOL) 30 MG/ML injection 30 mg (has no administration in time range)  morphine (PF) 4 MG/ML injection 4 mg (has no administration in time range)  ondansetron (ZOFRAN) injection 4 mg (has no administration in time range)    ED Course/ Medical Decision Making/ A&P  Patient is a 62 year old female presenting with right flank pain.  She was just seen here 2 days ago and diagnosed with a 4 mm stone at the right UVJ.  She was feeling better until this evening when the pain, nausea, and vomiting recurred.  Patient arrives here afebrile with stable vital signs.  She does have some CVA tenderness, but abdomen is benign.  IV access established and patient administered morphine, Toradol for pain and Zofran for nausea.  She is now pain-free and feels markedly improved.  Patient will be discharged with outpatient follow-up with urology if symptoms persist.  Final Clinical Impression(s) / ED Diagnoses Final diagnoses:  None    Rx / DC Orders ED Discharge Orders     None         Geoffery Lyons, MD 02/21/23 0236

## 2023-02-21 NOTE — Assessment & Plan Note (Addendum)
Chronic, she agrees to come to office next week for labwork. I will adjust meds as needed. Importance of medication and dietary compliance was discussed with the patient.

## 2023-02-21 NOTE — Assessment & Plan Note (Signed)
She has h/o multinodular goiter. Last u/s was performed in 2015. Will schedule u/s to document no changes.

## 2023-02-21 NOTE — Assessment & Plan Note (Signed)
Pt reports history of nephrolithiasis. She was previously followed by Urology. She "stopped going because he was not doing anything for me". Advised to complete full course of Keflex and to add lemon juice to her water. Importance of staying well hydrated was discussed with the patient. I do suggest that she f/u with Urology for further management. Also advised to stop drinking sweet tea.

## 2023-02-21 NOTE — Assessment & Plan Note (Signed)
This was seen on her CT renal study. She agrees to GYN referral and u/s for further evaluation.

## 2023-02-21 NOTE — Assessment & Plan Note (Signed)
Chronic.  She is encouraged to decrease her sodium intake and her intake of packaged/processed foods. For now, she will continue with lisinopril 20mg  daily and amlodipine 2.5mg  nightly.

## 2023-02-26 ENCOUNTER — Other Ambulatory Visit (HOSPITAL_COMMUNITY): Payer: Self-pay

## 2023-03-01 ENCOUNTER — Other Ambulatory Visit: Payer: 59

## 2023-03-02 ENCOUNTER — Other Ambulatory Visit (HOSPITAL_COMMUNITY): Payer: Self-pay

## 2023-03-02 ENCOUNTER — Other Ambulatory Visit: Payer: 59

## 2023-03-02 DIAGNOSIS — Z862 Personal history of diseases of the blood and blood-forming organs and certain disorders involving the immune mechanism: Secondary | ICD-10-CM

## 2023-03-02 DIAGNOSIS — N1831 Chronic kidney disease, stage 3a: Secondary | ICD-10-CM

## 2023-03-02 DIAGNOSIS — E041 Nontoxic single thyroid nodule: Secondary | ICD-10-CM | POA: Diagnosis not present

## 2023-03-02 DIAGNOSIS — E1122 Type 2 diabetes mellitus with diabetic chronic kidney disease: Secondary | ICD-10-CM | POA: Diagnosis not present

## 2023-03-05 LAB — CMP14+EGFR
ALT: 13 [IU]/L (ref 0–32)
AST: 15 [IU]/L (ref 0–40)
Albumin: 4 g/dL (ref 3.9–4.9)
Alkaline Phosphatase: 78 [IU]/L (ref 44–121)
BUN/Creatinine Ratio: 10 — ABNORMAL LOW (ref 12–28)
BUN: 12 mg/dL (ref 8–27)
Bilirubin Total: 0.4 mg/dL (ref 0.0–1.2)
CO2: 23 mmol/L (ref 20–29)
Calcium: 9.2 mg/dL (ref 8.7–10.3)
Chloride: 106 mmol/L (ref 96–106)
Creatinine, Ser: 1.15 mg/dL — ABNORMAL HIGH (ref 0.57–1.00)
Globulin, Total: 2.9 g/dL (ref 1.5–4.5)
Glucose: 85 mg/dL (ref 70–99)
Potassium: 4.1 mmol/L (ref 3.5–5.2)
Sodium: 142 mmol/L (ref 134–144)
Total Protein: 6.9 g/dL (ref 6.0–8.5)
eGFR: 54 mL/min/{1.73_m2} — ABNORMAL LOW (ref >59)

## 2023-03-05 LAB — TSH+FREE T4
Free T4: 1.57 ng/dL (ref 0.82–1.77)
TSH: 1.14 u[IU]/mL (ref 0.450–4.500)

## 2023-03-05 LAB — HEMOGLOBIN A1C
Est. average glucose Bld gHb Est-mCnc: 126 mg/dL
Hgb A1c MFr Bld: 6 % — ABNORMAL HIGH (ref 4.8–5.6)

## 2023-03-05 LAB — ANGIOTENSIN CONVERTING ENZYME: Angio Convert Enzyme: 7 U/L — ABNORMAL LOW (ref 14–82)

## 2023-03-23 ENCOUNTER — Ambulatory Visit
Admission: RE | Admit: 2023-03-23 | Discharge: 2023-03-23 | Disposition: A | Discharge status: Home / Self Care | Payer: 59 | Source: Ambulatory Visit | Attending: Internal Medicine | Admitting: Internal Medicine

## 2023-03-23 DIAGNOSIS — N838 Other noninflammatory disorders of ovary, fallopian tube and broad ligament: Secondary | ICD-10-CM | POA: Diagnosis not present

## 2023-03-23 DIAGNOSIS — E042 Nontoxic multinodular goiter: Secondary | ICD-10-CM | POA: Diagnosis not present

## 2023-03-23 DIAGNOSIS — N852 Hypertrophy of uterus: Secondary | ICD-10-CM

## 2023-03-23 DIAGNOSIS — E041 Nontoxic single thyroid nodule: Secondary | ICD-10-CM

## 2023-03-27 ENCOUNTER — Other Ambulatory Visit: Payer: Self-pay | Admitting: Internal Medicine

## 2023-03-27 ENCOUNTER — Other Ambulatory Visit (HOSPITAL_COMMUNITY): Payer: Self-pay

## 2023-03-27 MED ORDER — MOUNJARO 7.5 MG/0.5ML ~~LOC~~ SOAJ
7.5000 mg | SUBCUTANEOUS | 1 refills | Status: DC
  Filled 2023-03-27: qty 2, 28d supply, fill #0
  Filled 2023-04-24: qty 2, 28d supply, fill #1

## 2023-03-29 ENCOUNTER — Other Ambulatory Visit (HOSPITAL_COMMUNITY): Payer: Self-pay

## 2023-04-25 ENCOUNTER — Other Ambulatory Visit (HOSPITAL_COMMUNITY): Payer: Self-pay

## 2023-05-03 ENCOUNTER — Encounter: Payer: Commercial Managed Care - HMO | Admitting: Internal Medicine

## 2023-05-18 ENCOUNTER — Other Ambulatory Visit: Payer: Self-pay | Admitting: Internal Medicine

## 2023-05-18 ENCOUNTER — Other Ambulatory Visit (HOSPITAL_COMMUNITY): Payer: Self-pay

## 2023-05-18 MED ORDER — MOUNJARO 7.5 MG/0.5ML ~~LOC~~ SOAJ
7.5000 mg | SUBCUTANEOUS | 1 refills | Status: DC
  Filled 2023-05-18 – 2023-06-01 (×2): qty 2, 28d supply, fill #0

## 2023-05-23 ENCOUNTER — Other Ambulatory Visit (HOSPITAL_COMMUNITY): Payer: Self-pay

## 2023-05-23 ENCOUNTER — Other Ambulatory Visit: Payer: Self-pay

## 2023-05-23 ENCOUNTER — Other Ambulatory Visit: Payer: Self-pay | Admitting: Internal Medicine

## 2023-05-23 MED ORDER — LISINOPRIL 20 MG PO TABS
20.0000 mg | ORAL_TABLET | Freq: Every day | ORAL | 1 refills | Status: DC
  Filled 2023-05-23 – 2023-06-05 (×2): qty 90, 90d supply, fill #0

## 2023-05-31 ENCOUNTER — Telehealth: Payer: Self-pay | Admitting: Internal Medicine

## 2023-05-31 ENCOUNTER — Other Ambulatory Visit (HOSPITAL_COMMUNITY): Payer: Self-pay

## 2023-05-31 NOTE — Telephone Encounter (Signed)
 Copied from CRM [id number]. Topic: Clinical - Medication Question >> May 31, 2023  4:31 PM Donna Duarte wrote: Reason for CRM: Patient is currently taking tirzepatide [facility]) 7.5 MG/0.5ML Pen and has recently lost her job and insurance and wants to know if she could possibly get Ozempic or anything that wouldn't cost too much. She is on her last dose of Mounjaro

## 2023-06-01 ENCOUNTER — Other Ambulatory Visit (HOSPITAL_COMMUNITY): Payer: Self-pay

## 2023-06-01 ENCOUNTER — Other Ambulatory Visit: Payer: Self-pay

## 2023-06-01 NOTE — Telephone Encounter (Signed)
 Pt called reporting that she missed a call from the clinic. States she is returning a call from a nurse.   Best contact: [phone]

## 2023-06-04 ENCOUNTER — Other Ambulatory Visit (HOSPITAL_COMMUNITY): Payer: Self-pay

## 2023-06-05 ENCOUNTER — Other Ambulatory Visit (HOSPITAL_COMMUNITY): Payer: Self-pay

## 2023-06-07 ENCOUNTER — Other Ambulatory Visit: Payer: Self-pay

## 2023-08-27 ENCOUNTER — Encounter: Payer: Self-pay | Admitting: Internal Medicine

## 2023-08-27 ENCOUNTER — Ambulatory Visit: Payer: Self-pay | Admitting: Internal Medicine

## 2023-08-27 VITALS — BP 140/82 | HR 85 | Temp 98.4°F | Ht 66.0 in | Wt 208.4 lb

## 2023-08-27 DIAGNOSIS — E66811 Obesity, class 1: Secondary | ICD-10-CM | POA: Diagnosis not present

## 2023-08-27 DIAGNOSIS — Z566 Other physical and mental strain related to work: Secondary | ICD-10-CM | POA: Insufficient documentation

## 2023-08-27 DIAGNOSIS — Z6833 Body mass index (BMI) 33.0-33.9, adult: Secondary | ICD-10-CM

## 2023-08-27 DIAGNOSIS — I129 Hypertensive chronic kidney disease with stage 1 through stage 4 chronic kidney disease, or unspecified chronic kidney disease: Secondary | ICD-10-CM

## 2023-08-27 DIAGNOSIS — E1122 Type 2 diabetes mellitus with diabetic chronic kidney disease: Secondary | ICD-10-CM | POA: Diagnosis not present

## 2023-08-27 DIAGNOSIS — E6609 Other obesity due to excess calories: Secondary | ICD-10-CM

## 2023-08-27 DIAGNOSIS — N1831 Chronic kidney disease, stage 3a: Secondary | ICD-10-CM | POA: Diagnosis not present

## 2023-08-27 MED ORDER — AMLODIPINE BESYLATE 5 MG PO TABS: 2.5000 mg | ORAL_TABLET | Freq: Every day | ORAL | 1 refills | Status: DC

## 2023-08-27 MED ORDER — ATORVASTATIN CALCIUM 10 MG PO TABS: 10.0000 mg | ORAL_TABLET | Freq: Every day | ORAL | 3 refills | Status: DC

## 2023-08-27 MED ORDER — MOUNJARO 5 MG/0.5ML ~~LOC~~ SOAJ: 5.0000 mg | SUBCUTANEOUS | 0 refills | Status: AC

## 2023-08-27 MED ORDER — LISINOPRIL 20 MG PO TABS: 20.0000 mg | ORAL_TABLET | Freq: Every day | ORAL | 1 refills | Status: DC

## 2023-08-27 NOTE — Assessment & Plan Note (Signed)
 Recent lack of blood glucose monitoring. Previously well-controlled with Mounjaro . Currently not on medication due to insurance issues. Interested in restarting Mounjaro . - Provide sample of Mounjaro . - Discuss cost and insurance coverage for Mounjaro . - Encourage MyChart sign-up for communication.

## 2023-08-27 NOTE — Assessment & Plan Note (Signed)
 She is encouraged to strive for BMI less than 30 to decrease cardiac risk. Advised to aim for at least 150 minutes of exercise per week.

## 2023-08-27 NOTE — Assessment & Plan Note (Signed)
 Chronic, uncontrolled. Admits to non-compliance with meds this morning. She will continue with lisinopril  and amlodipine .  - May need to switch from Ace- to ARB - Follow low sodium diet - Stress importance of compliance

## 2023-08-27 NOTE — Progress Notes (Signed)
 I,Victoria T Basil Lim, CMA,acting as a Neurosurgeon for Smiley Dung, MD.,have documented all relevant documentation on the behalf of Smiley Dung, MD,as directed by  Smiley Dung, MD while in the presence of Smiley Dung, MD.  Subjective:  Patient ID: Donna Duarte , female    DOB: 11-06-1960 , 63 y.o.   MRN: [id number]  Chief Complaint  Patient presents with   Diabetes    Patient presents today for diabetes & bp follow up. She reports compliance with medications. Denies headache, chest pain & sob. She would like to discuss resuming Mounjaro . She is not sure if her new  insurance covers medication. She denies completing mammogram or dm eye exam.   Hypertension    HPI Discussed the use of AI scribe software for clinical note transcription with the patient, who gave verbal consent to proceed.  History of Present Illness Donna Duarte is a 63 year old female with diabetes and high blood pressure who presents for a diabetes check.  She has not checked her blood sugar levels in the past two weeks. Previously, her morning blood sugar levels were around 130 mg/dL, with the lowest reading being around 99 to 100 mg/dL. She has been off her diabetes medication, Mounjaro , due to a lack of insurance coverage, although she found it effective and noted weight loss while on it. She recently obtained new insurance through [employer].  She is currently taking amlodipine  and lisinopril  for blood pressure management and atorvastatin . She missed her scheduled eye exam with Dr. Jolena Nay at [facility] and a mammogram due to insurance issues. These appointments were scheduled for May but had to be canceled.  She recently started a new job at [employer], where she is experiencing workplace issues, including perceived hostility from her Child psychotherapist. She feels she is not receiving adequate training and is concerned about her job security. She has a history of similar issues at previous jobs,  which she attributes to potential bias or misunderstanding.   Diabetes She presents for her follow-up diabetic visit. She has type 2 diabetes mellitus. Her disease course has been stable. Pertinent negatives for diabetes include no blurred vision, no polydipsia, no polyphagia and no polyuria. There are no hypoglycemic complications. Risk factors for coronary artery disease include diabetes mellitus, dyslipidemia, hypertension, post-menopausal and sedentary lifestyle. An ACE inhibitor/angiotensin II receptor blocker is being taken.  Hypertension This is a chronic problem. The current episode started more than 1 year ago. The problem has been gradually improving since onset. The problem is controlled. Pertinent negatives include no blurred vision. Risk factors for coronary artery disease include dyslipidemia and obesity. Past treatments include ACE inhibitors. The current treatment provides moderate improvement.     Past Medical History:  Diagnosis Date   Allergy    Diabetes mellitus without complication (HCC)    Hyperlipidemia    Hypertension    Kidney stone    Thyroid  disease      Family History  Problem Relation Age of Onset   Hypertension Mother    Diabetes Mother      Current Outpatient Medications:    glucose blood (ONETOUCH VERIO) test strip, Check blood sugar twice daily., Disp: 100 each, Rfl: 12   Insulin  Pen Needle (PEN NEEDLES) 32G X 4 MM MISC, Use as directed with Ozempic  pen, Disp: 100 each, Rfl: 1   Lancets (ONETOUCH DELICA PLUS LANCET30G) MISC, Use as directed to check blood sugars twice daily., Disp: 100 each, Rfl: 2   tirzepatide  (  MOUNJARO ) 5 MG/0.5ML Pen, Inject 5 mg into the skin once a week., Disp: 2 mL, Rfl: 0   albuterol  (VENTOLIN  HFA) 108 (90 Base) MCG/ACT inhaler, Inhale into the lungs every 6 (six) hours as needed for wheezing or shortness of breath. (Patient not taking: Reported on 12/20/2021), Disp: , Rfl:    amLODipine  (NORVASC ) 5 MG tablet, Take 0.5 tablets  (2.5 mg total) by mouth daily with evening meal, Disp: 90 tablet, Rfl: 1   atorvastatin  (LIPITOR) 10 MG tablet, Take 1 tablet (10 mg total) by mouth daily., Disp: 90 tablet, Rfl: 3   lisinopril  (ZESTRIL ) 20 MG tablet, Take 1 tablet (20 mg total) by mouth daily., Disp: 90 tablet, Rfl: 1   ondansetron  (ZOFRAN -ODT) 4 MG disintegrating tablet, Dissolve 1 tablet (4 mg total) by mouth every 8 (eight) hours as needed for nausea or vomiting., Disp: 20 tablet, Rfl: 0   tamsulosin  (FLOMAX ) 0.4 MG CAPS capsule, Take 1 capsule (0.4 mg total) by mouth daily. (Patient not taking: Reported on 08/27/2023), Disp: 30 capsule, Rfl: 0   Allergies  Allergen Reactions   Fish Oil Hives and Itching   Penicillins Hives and Swelling   Fentanyl Itching     Review of Systems  Constitutional: Negative.   Eyes:  Negative for blurred vision.  Respiratory: Negative.    Cardiovascular: Negative.   Gastrointestinal: Negative.   Endocrine: Negative for polydipsia, polyphagia and polyuria.  Neurological: Negative.   Psychiatric/Behavioral: Negative.       Today's Vitals   08/27/23 0826 08/27/23 0852  BP: (!) 142/80 (!) 140/82  Pulse: 85   Temp: 98.4 F (36.9 C)   SpO2: 98%   Weight: 208 lb 6.4 oz (94.5 kg)   Height: 5\' 6"  (1.676 m)    Body mass index is 33.64 kg/m.  Wt Readings from Last 3 Encounters:  08/27/23 208 lb 6.4 oz (94.5 kg)  02/18/23 210 lb (95.3 kg)  11/16/22 215 lb (97.5 kg)     Objective:  Physical Exam Vitals and nursing note reviewed.  Constitutional:      Appearance: Normal appearance. She is obese.  HENT:     Head: Normocephalic and atraumatic.  Eyes:     Extraocular Movements: Extraocular movements intact.  Cardiovascular:     Rate and Rhythm: Normal rate and regular rhythm.     Heart sounds: Normal heart sounds.  Pulmonary:     Effort: Pulmonary effort is normal.     Breath sounds: Normal breath sounds.  Musculoskeletal:     Cervical back: Normal range of motion.  Skin:     General: Skin is warm.  Neurological:     General: No focal deficit present.     Mental Status: She is alert.  Psychiatric:        Mood and Affect: Mood normal.        Behavior: Behavior normal.         Assessment And Plan:  Type 2 diabetes mellitus with stage 3a chronic kidney disease, without long-term current use of insulin  (HCC) Assessment & Plan: Recent lack of blood glucose monitoring. Previously well-controlled with Mounjaro . Currently not on medication due to insurance issues. Interested in restarting Mounjaro . - Provide sample of Mounjaro . - Discuss cost and insurance coverage for Mounjaro . - Encourage MyChart sign-up for communication.  Orders: -     CBC -     CMP14+EGFR -     Lipid panel -     Hemoglobin A1c -     PTH, intact and  calcium  -     Phosphorus -     Protein electrophoresis, serum  Parenchymal renal hypertension, stage 1 through stage 4 or unspecified chronic kidney disease Assessment & Plan: Chronic, uncontrolled. Admits to non-compliance with meds this morning. She will continue with lisinopril  and amlodipine .  - May need to switch from Ace- to ARB - Follow low sodium diet - Stress importance of compliance  Orders: -     CMP14+EGFR -     Lipid panel  Class 1 obesity due to excess calories with serious comorbidity and body mass index (BMI) of 33.0 to 33.9 in adult Assessment & Plan: She is encouraged to strive for BMI less than 30 to decrease cardiac risk. Advised to aim for at least 150 minutes of exercise per week.    Work stress Assessment & Plan: Significant stress and interpersonal issues at new job affecting well-being and performance. Considering job transfer. Therapy may benefit stress management. - Discuss therapy for stress and coping. - Explore job transfer within [employer].   Other orders -     amLODIPine  Besylate; Take 0.5 tablets (2.5 mg total) by mouth daily with evening meal  Dispense: 90 tablet; Refill: 1 -     Lisinopril ;  Take 1 tablet (20 mg total) by mouth daily.  Dispense: 90 tablet; Refill: 1 -     Atorvastatin  Calcium ; Take 1 tablet (10 mg total) by mouth daily.  Dispense: 90 tablet; Refill: 3 -     Mounjaro ; Inject 5 mg into the skin once a week.  Dispense: 2 mL; Refill: 0  General Health Maintenance Overdue for mammogram and eye exam. Missed due to insurance and job commitments. New job and insurance may help scheduling. - Schedule mammogram. - Schedule eye exam with Dr. Jolena Nay at [facility].   Return in 3 months (on 11/27/2023), or dm check.  Patient was given opportunity to ask questions. Patient verbalized understanding of the plan and was able to repeat key elements of the plan. All questions were answered to their satisfaction.   I, Smiley Dung, MD, have reviewed all documentation for this visit. The documentation on 08/27/23 for the exam, diagnosis, procedures, and orders are all accurate and complete.   IF YOU HAVE BEEN REFERRED TO A SPECIALIST, IT MAY TAKE 1-2 WEEKS TO SCHEDULE/PROCESS THE REFERRAL. IF YOU HAVE NOT HEARD FROM US /SPECIALIST IN TWO WEEKS, PLEASE GIVE US  A CALL AT [phone].   THE PATIENT IS ENCOURAGED TO PRACTICE SOCIAL DISTANCING DUE TO THE COVID-19 PANDEMIC.

## 2023-08-27 NOTE — Patient Instructions (Signed)

## 2023-08-27 NOTE — Assessment & Plan Note (Signed)
 Significant stress and interpersonal issues at new job affecting well-being and performance. Considering job transfer. Therapy may benefit stress management. - Discuss therapy for stress and coping. - Explore job transfer within [employer].

## 2023-08-28 LAB — PROTEIN ELECTROPHORESIS, SERUM
A/G Ratio: 1.1 (ref 0.7–1.7)
Albumin ELP: 3.6 g/dL (ref 2.9–4.4)
Alpha 1: 0.2 g/dL (ref 0.0–0.4)
Alpha 2: 0.7 g/dL (ref 0.4–1.0)
Beta: 1.1 g/dL (ref 0.7–1.3)
Gamma Globulin: 1.3 g/dL (ref 0.4–1.8)
Globulin, Total: 3.3 g/dL (ref 2.2–3.9)

## 2023-08-28 LAB — CMP14+EGFR
ALT: 11 IU/L (ref 0–32)
AST: 15 IU/L (ref 0–40)
Albumin: 4.4 g/dL (ref 3.9–4.9)
Alkaline Phosphatase: 104 IU/L (ref 44–121)
BUN/Creatinine Ratio: 16 (ref 12–28)
BUN: 16 mg/dL (ref 8–27)
Bilirubin Total: 0.3 mg/dL (ref 0.0–1.2)
CO2: 21 mmol/L (ref 20–29)
Calcium: 9.4 mg/dL (ref 8.7–10.3)
Chloride: 108 mmol/L — ABNORMAL HIGH (ref 96–106)
Creatinine, Ser: 0.99 mg/dL (ref 0.57–1.00)
Globulin, Total: 2.5 g/dL (ref 1.5–4.5)
Glucose: 80 mg/dL (ref 70–99)
Potassium: 3.9 mmol/L (ref 3.5–5.2)
Sodium: 143 mmol/L (ref 134–144)
Total Protein: 6.9 g/dL (ref 6.0–8.5)
eGFR: 64 mL/min/{1.73_m2} (ref >59)

## 2023-08-28 LAB — LIPID PANEL
Chol/HDL Ratio: 3.7 ratio (ref 0.0–4.4)
Cholesterol, Total: 164 mg/dL (ref 100–199)
HDL: 44 mg/dL (ref >39)
LDL Chol Calc (NIH): 105 mg/dL — ABNORMAL HIGH (ref 0–99)
Triglycerides: 78 mg/dL (ref 0–149)
VLDL Cholesterol Cal: 15 mg/dL (ref 5–40)

## 2023-08-28 LAB — HEMOGLOBIN A1C
Est. average glucose Bld gHb Est-mCnc: 128 mg/dL
Hgb A1c MFr Bld: 6.1 % — ABNORMAL HIGH (ref 4.8–5.6)

## 2023-08-28 LAB — CBC
Hematocrit: 42.4 % (ref 34.0–46.6)
Hemoglobin: 13.8 g/dL (ref 11.1–15.9)
MCH: 29.1 pg (ref 26.6–33.0)
MCHC: 32.5 g/dL (ref 31.5–35.7)
MCV: 90 fL (ref 79–97)
Platelets: 194 10*3/uL (ref 150–450)
RBC: 4.74 x10E6/uL (ref 3.77–5.28)
RDW: 13.2 % (ref 11.7–15.4)
WBC: 4.5 10*3/uL (ref 3.4–10.8)

## 2023-08-28 LAB — PTH, INTACT AND CALCIUM: PTH: 39 pg/mL (ref 15–65)

## 2023-08-28 LAB — PHOSPHORUS: Phosphorus: 2.6 mg/dL — ABNORMAL LOW (ref 3.0–4.3)

## 2023-09-03 ENCOUNTER — Ambulatory Visit: Payer: Self-pay | Admitting: Internal Medicine

## 2023-09-17 ENCOUNTER — Other Ambulatory Visit: Payer: Self-pay | Admitting: Internal Medicine

## 2023-09-17 MED ORDER — ATORVASTATIN CALCIUM 20 MG PO TABS: 20.0000 mg | ORAL_TABLET | Freq: Every day | ORAL | 2 refills | Status: AC

## 2024-01-28 ENCOUNTER — Telehealth: Payer: Self-pay | Admitting: Internal Medicine

## 2024-01-28 NOTE — Telephone Encounter (Signed)
 Called pt to inform her that her 11/12 appt has been moved down to 3:40 no anwser left VM

## 2024-02-06 ENCOUNTER — Ambulatory Visit: Payer: Self-pay | Admitting: Internal Medicine

## 2024-03-12 LAB — COMPREHENSIVE METABOLIC PANEL WITH GFR
Albumin: 14.9 — AB (ref 3.5–5.0)
Albumin: 4.4 (ref 3.5–5.0)
Calcium: 9.8 (ref 8.7–10.7)
eGFR: 62

## 2024-03-12 LAB — BASIC METABOLIC PANEL WITH GFR
BUN: 13 (ref 4–21)
CO2: 28 — AB (ref 13–22)
Chloride: 103 (ref 99–108)
Creatinine: 1 (ref 0.5–1.1)
Glucose: 96
Potassium: 4 meq/L (ref 3.5–5.1)
Sodium: 142 (ref 137–147)

## 2024-03-12 LAB — PROTEIN / CREATININE RATIO, URINE
Albumin, U: 14.9
Creatinine, Urine: 95.1

## 2024-03-13 ENCOUNTER — Ambulatory Visit: Admitting: Internal Medicine

## 2024-03-13 ENCOUNTER — Encounter: Payer: Self-pay | Admitting: Internal Medicine

## 2024-03-13 VITALS — BP 122/80 | HR 84 | Temp 98.3°F | Ht 66.0 in | Wt 205.8 lb

## 2024-03-13 DIAGNOSIS — I129 Hypertensive chronic kidney disease with stage 1 through stage 4 chronic kidney disease, or unspecified chronic kidney disease: Secondary | ICD-10-CM

## 2024-03-13 DIAGNOSIS — N1831 Chronic kidney disease, stage 3a: Secondary | ICD-10-CM

## 2024-03-13 DIAGNOSIS — N200 Calculus of kidney: Secondary | ICD-10-CM

## 2024-03-13 DIAGNOSIS — E66811 Obesity, class 1: Secondary | ICD-10-CM

## 2024-03-13 DIAGNOSIS — Z6833 Body mass index (BMI) 33.0-33.9, adult: Secondary | ICD-10-CM

## 2024-03-13 DIAGNOSIS — E6609 Other obesity due to excess calories: Secondary | ICD-10-CM

## 2024-03-13 DIAGNOSIS — E1122 Type 2 diabetes mellitus with diabetic chronic kidney disease: Secondary | ICD-10-CM

## 2024-03-13 LAB — LAB REPORT - SCANNED
Albumin, Urine POC: 14.9
Creatinine, POC: 95.1 mg/dL
EGFR: 62
Microalb Creat Ratio: 16

## 2024-03-13 MED ORDER — LISINOPRIL 20 MG PO TABS: 20.0000 mg | ORAL_TABLET | Freq: Every day | ORAL | 1 refills | Status: AC

## 2024-03-13 MED ORDER — AMLODIPINE BESYLATE 5 MG PO TABS: 2.5000 mg | ORAL_TABLET | Freq: Every day | ORAL | 1 refills | Status: AC

## 2024-03-13 NOTE — Progress Notes (Unsigned)
 I,Victoria T Emmitt, CMA,acting as a neurosurgeon for Catheryn LOISE Slocumb, MD.,have documented all relevant documentation on the behalf of Catheryn LOISE Slocumb, MD,as directed by  Catheryn LOISE Slocumb, MD while in the presence of Catheryn LOISE Slocumb, MD.  Subjective:  Patient ID: Donna Duarte , female    DOB: 07-11-1960 , 63 y.o.   MRN: [id number]  Chief Complaint  Patient presents with   Diabetes    Patient is here for DM and HTN check. She reports compliance with meds. She denies visual disturbances, dizziness & headache. She denies having any specific questions or concerns.  She denies completing dm eye exam & mammogram.    Hypertension    HPI Discussed the use of AI scribe software for clinical note transcription with the patient, who gave verbal consent to proceed.  History of Present Illness Donna Duarte is a 63 year old female with diabetes and hypertension who presents for a follow-up visit.  She has not been consistently monitoring her blood glucose levels and has not been taking her diabetes medication regularly. She has not been on any medication for her diabetes since receiving a sample of Mounjaro , which she stopped after her employment and insurance changed.  She has a history of hypertension and has not filled her prescriptions for lisinopril  or amlodipine  since June. She has not been taking her blood pressure medication daily, although she has some leftover medication.  She is currently unemployed and is taking care of her husband, who was recently diagnosed with bladder cancer. She is experiencing difficulty in finding employment and managing her insurance coverage, which has impacted her ability to afford medications. She has Medicare and a supplemental insurance plan but is concerned about affording the premiums. She previously faced issues with insurance coverage when transitioning from [employer] companies to employer insurance, resulting in unexpected charges.   Diabetes She presents for her  follow-up diabetic visit. She has type 2 diabetes mellitus. Her disease course has been stable. Pertinent negatives for diabetes include no blurred vision, no polydipsia, no polyphagia and no polyuria. There are no hypoglycemic complications. Risk factors for coronary artery disease include diabetes mellitus, dyslipidemia, hypertension, post-menopausal and sedentary lifestyle. An ACE inhibitor/angiotensin II receptor blocker is being taken.  Hypertension This is a chronic problem. The current episode started more than 1 year ago. The problem has been gradually improving since onset. The problem is controlled. Pertinent negatives include no blurred vision. Risk factors for coronary artery disease include dyslipidemia and obesity. Past treatments include ACE inhibitors. The current treatment provides moderate improvement.     Past Medical History:  Diagnosis Date   Allergy    Diabetes mellitus without complication (HCC)    Hyperlipidemia    Hypertension    Kidney stone    Thyroid  disease      Family History  Problem Relation Age of Onset   Hypertension Mother    Diabetes Mother     Current Medications[1]   Allergies[2]   Review of Systems  Constitutional: Negative.   Eyes:  Negative for blurred vision.  Respiratory: Negative.    Cardiovascular: Negative.   Gastrointestinal: Negative.   Endocrine: Negative for polydipsia, polyphagia and polyuria.  Neurological: Negative.   Psychiatric/Behavioral: Negative.       Today's Vitals   03/13/24 1529  BP: 122/80  Pulse: 84  Temp: 98.3 F (36.8 C)  SpO2: 98%  Weight: 205 lb 12.8 oz (93.4 kg)  Height: 5' 6 (1.676 m)   Body mass index is 33.22 kg/m.  Wt Readings from Last 3 Encounters:  03/13/24 205 lb 12.8 oz (93.4 kg)  08/27/23 208 lb 6.4 oz (94.5 kg)  02/18/23 210 lb (95.3 kg)    The 10-year ASCVD risk score (Arnett DK, et al., 2019) is: 15.7%   Values used to calculate the score:     Age: 15 years     Clinically  relevant sex: Female     Is Non-Hispanic African American: Yes     Diabetic: Yes     Tobacco smoker: No     Systolic Blood Pressure: 122 mmHg     Is BP treated: Yes     HDL Cholesterol: 44 mg/dL     Total Cholesterol: 164 mg/dL  Objective:  Physical Exam Vitals and nursing note reviewed.  Constitutional:      Appearance: Normal appearance. She is obese.  HENT:     Head: Normocephalic and atraumatic.  Cardiovascular:     Rate and Rhythm: Normal rate and regular rhythm.     Heart sounds: Normal heart sounds.  Pulmonary:     Effort: Pulmonary effort is normal.     Breath sounds: Normal breath sounds.  Musculoskeletal:     Cervical back: Normal range of motion.  Skin:    General: Skin is warm.  Neurological:     General: No focal deficit present.     Mental Status: She is alert.  Psychiatric:        Mood and Affect: Mood normal.        Behavior: Behavior normal.         Assessment And Plan:   Assessment & Plan Type 2 diabetes mellitus with stage 3a chronic kidney disease, without long-term current use of insulin  (HCC)  Parenchymal renal hypertension, stage 1 through stage 4 or unspecified chronic kidney disease  Nephrolithiasis   Assessment & Plan Diabetes Mellitus Inconsistent medication adherence and irregular blood glucose monitoring. Emphasized importance of glycemic control to prevent complications. Discussed medication options including Farxiga and Jardiance for cardioprotective and renoprotective benefits. - Refer to pharmacy for patient assistance program for diabetes medications. - Order labs to assess current diabetes status. - Discuss medication options after lab results are available.  Hypertension Inconsistent medication adherence. Blood pressure at 122/80 mmHg. Emphasized importance of lisinopril  for renal protection. - Encourage consistent use of lisinopril  for renal protection. - Monitor blood pressure regularly.  General Health  Maintenance Medicare insurance holder without recent mammogram. Requires Welcome to Medicare visit and diabetes check in April. - Schedule a Welcome to [facility] visit in April. - Schedule a diabetes check in April.   No orders of the defined types were placed in this encounter.  Return for 4 MONTH DM F/U.SABRA  Patient was given opportunity to ask questions. Patient verbalized understanding of the plan and was able to repeat key elements of the plan. All questions were answered to their satisfaction.    I, Catheryn LOISE Slocumb, MD, have reviewed all documentation for this visit. The documentation on 03/13/2024 for the exam, diagnosis, procedures, and orders are all accurate and complete.   IF YOU HAVE BEEN REFERRED TO A SPECIALIST, IT MAY TAKE 1-2 WEEKS TO SCHEDULE/PROCESS THE REFERRAL. IF YOU HAVE NOT HEARD FROM US /SPECIALIST IN TWO WEEKS, PLEASE GIVE US  A CALL AT [phone].      [1]  Current Outpatient Medications:    amLODipine  (NORVASC ) 5 MG tablet, Take 0.5 tablets (2.5 mg total) by mouth daily with evening meal, Disp: 90 tablet, Rfl: 1  atorvastatin  (LIPITOR) 20 MG tablet, Take 1 tablet (20 mg total) by mouth daily., Disp: 30 tablet, Rfl: 2   glucose blood (ONETOUCH VERIO) test strip, Check blood sugar twice daily., Disp: 100 each, Rfl: 12   Insulin  Pen Needle (PEN NEEDLES) 32G X 4 MM MISC, Use as directed with Ozempic  pen, Disp: 100 each, Rfl: 1   Lancets (ONETOUCH DELICA PLUS LANCET30G) MISC, Use as directed to check blood sugars twice daily., Disp: 100 each, Rfl: 2   lisinopril  (ZESTRIL ) 20 MG tablet, Take 1 tablet (20 mg total) by mouth daily., Disp: 90 tablet, Rfl: 1   ondansetron  (ZOFRAN -ODT) 4 MG disintegrating tablet, Dissolve 1 tablet (4 mg total) by mouth every 8 (eight) hours as needed for nausea or vomiting., Disp: 20 tablet, Rfl: 0   albuterol  (VENTOLIN  HFA) 108 (90 Base) MCG/ACT inhaler, Inhale into the lungs every 6 (six) hours as needed for wheezing or shortness of  breath. (Patient not taking: Reported on 03/13/2024), Disp: , Rfl:    tamsulosin  (FLOMAX ) 0.4 MG CAPS capsule, Take 1 capsule (0.4 mg total) by mouth daily. (Patient not taking: Reported on 03/13/2024), Disp: 30 capsule, Rfl: 0   tirzepatide  (MOUNJARO ) 5 MG/0.5ML Pen, Inject 5 mg into the skin once a week. (Patient not taking: Reported on 03/13/2024), Disp: 2 mL, Rfl: 0 [2]  Allergies Allergen Reactions   Fish Oil Hives and Itching   Penicillins Hives and Swelling   Fentanyl Itching

## 2024-03-13 NOTE — Patient Instructions (Signed)

## 2024-03-14 LAB — HEPATIC FUNCTION PANEL
ALT: 16 IU/L (ref 0–32)
AST: 18 IU/L (ref 0–40)
Albumin: 4.4 g/dL (ref 3.9–4.9)
Alkaline Phosphatase: 104 IU/L (ref 49–135)
Bilirubin Total: 0.4 mg/dL (ref 0.0–1.2)
Bilirubin, Direct: 0.11 mg/dL (ref 0.00–0.40)
Total Protein: 7 g/dL (ref 6.0–8.5)

## 2024-03-14 LAB — HEMOGLOBIN A1C
Est. average glucose Bld gHb Est-mCnc: 134 mg/dL
Hgb A1c MFr Bld: 6.3 % — ABNORMAL HIGH (ref 4.8–5.6)

## 2024-03-16 NOTE — Assessment & Plan Note (Signed)
 Inconsistent medication adherence and irregular blood glucose monitoring. Emphasized importance of glycemic control to prevent complications. Discussed medication options including Farxiga and Jardiance for cardioprotective and renoprotective benefits. - Refer to pharmacy for patient assistance program for diabetes medications. - Order labs to assess current diabetes status. - Discuss medication options after lab results are available.

## 2024-03-16 NOTE — Assessment & Plan Note (Signed)
 She is encouraged to strive for BMI less than 30 to decrease cardiac risk. Advised to aim for at least 150 minutes of exercise per week.

## 2024-03-16 NOTE — Assessment & Plan Note (Signed)
 Chronic, controlled.  Inconsistent medication adherence. Blood pressure at 122/80 mmHg. Emphasized importance of lisinopril  for renal protection. - Encourage consistent use of lisinopril  for renal protection. - Monitor blood pressure regularly.

## 2024-03-17 ENCOUNTER — Other Ambulatory Visit

## 2024-03-17 ENCOUNTER — Other Ambulatory Visit: Payer: Self-pay

## 2024-03-17 DIAGNOSIS — Z111 Encounter for screening for respiratory tuberculosis: Secondary | ICD-10-CM

## 2024-03-19 ENCOUNTER — Ambulatory Visit: Payer: Self-pay | Admitting: Internal Medicine

## 2024-03-19 LAB — QUANTIFERON-TB GOLD PLUS
QuantiFERON Mitogen Value: >10 [IU]/mL
QuantiFERON Nil Value: 0.05 [IU]/mL
QuantiFERON TB1 Ag Value: 0.05 [IU]/mL
QuantiFERON TB2 Ag Value: 0.04 [IU]/mL
QuantiFERON-TB Gold Plus: NEGATIVE

## 2024-03-21 ENCOUNTER — Ambulatory Visit: Payer: Self-pay | Admitting: Internal Medicine

## 2024-08-14 ENCOUNTER — Encounter: Payer: Self-pay | Admitting: Internal Medicine
